# Patient Record
Sex: Female | Born: 1985 | Hispanic: No | Marital: Single | Smoking: Current every day smoker
Health system: Southern US, Community
[De-identification: ages and names within clinical notes are randomized; demographics above are authoritative.]

## PROBLEM LIST (undated history)

## (undated) DIAGNOSIS — D573 Sickle-cell trait: Secondary | ICD-10-CM

## (undated) DIAGNOSIS — J45909 Unspecified asthma, uncomplicated: Secondary | ICD-10-CM

## (undated) DIAGNOSIS — F4310 Post-traumatic stress disorder, unspecified: Secondary | ICD-10-CM

## (undated) DIAGNOSIS — F32A Depression, unspecified: Secondary | ICD-10-CM

## (undated) DIAGNOSIS — F329 Major depressive disorder, single episode, unspecified: Secondary | ICD-10-CM

## (undated) DIAGNOSIS — B1920 Unspecified viral hepatitis C without hepatic coma: Secondary | ICD-10-CM

## (undated) DIAGNOSIS — F419 Anxiety disorder, unspecified: Secondary | ICD-10-CM

## (undated) DIAGNOSIS — N189 Chronic kidney disease, unspecified: Secondary | ICD-10-CM

## (undated) DIAGNOSIS — F319 Bipolar disorder, unspecified: Secondary | ICD-10-CM

## (undated) DIAGNOSIS — G43909 Migraine, unspecified, not intractable, without status migrainosus: Secondary | ICD-10-CM

## (undated) DIAGNOSIS — M79609 Pain in unspecified limb: Secondary | ICD-10-CM

## (undated) HISTORY — PX: ESSURE TUBAL LIGATION: SUR464

## (undated) HISTORY — PX: NO PAST SURGERIES: SHX2092

## (undated) HISTORY — PX: WISDOM TOOTH EXTRACTION: SHX21

## (undated) MED ORDER — TRIMETHOPRIM-POLYMYXIN B 0.1 %-10,000 UNIT/ML EYE DROPS: 10000 unit- 1 mg/mL | OPHTHALMIC | Status: AC

## (undated) MED ORDER — TRAMADOL 50 MG TAB: 50 mg | ORAL_TABLET | Freq: Four times a day (QID) | ORAL | Status: AC | PRN

---

## 2008-06-10 LAB — METABOLIC PANEL, COMPREHENSIVE
A-G Ratio: 1.2 (ref 1.1–2.2)
ALT (SGPT): 29 U/L — ABNORMAL LOW (ref 30–65)
AST (SGOT): 13 U/L — ABNORMAL LOW (ref 15–37)
Albumin: 4.4 g/dL (ref 3.5–5.0)
Alk. phosphatase: 108 U/L (ref 50–136)
Anion gap: 8 mmol/L (ref 5–15)
BUN/Creatinine ratio: 9 — ABNORMAL LOW (ref 12–20)
BUN: 8 MG/DL (ref 6–20)
Bilirubin, total: 0.6 MG/DL (ref <1.0)
CO2: 25 MMOL/L (ref 21–32)
Calcium: 9 MG/DL (ref 8.5–10.1)
Chloride: 104 MMOL/L (ref 97–108)
Creatinine: 0.9 MG/DL (ref 0.6–1.3)
GFR est AA: >60 mL/min/{1.73_m2} (ref >60)
GFR est non-AA: >60 mL/min/{1.73_m2} (ref >60)
Globulin: 3.7 g/dL (ref 2.0–4.0)
Glucose: 102 MG/DL — ABNORMAL HIGH (ref 50–100)
Potassium: 4 MMOL/L (ref 3.5–5.1)
Protein, total: 8.1 g/dL (ref 6.4–8.2)
Sodium: 137 MMOL/L (ref 136–145)

## 2008-06-10 LAB — CBC WITH AUTOMATED DIFF
ABS. BASOPHILS: 0.1 10*3/uL (ref 0.0–0.1)
ABS. EOSINOPHILS: 0.1 10*3/uL (ref 0.0–0.4)
ABS. LYMPHOCYTES: 2.5 10*3/uL (ref 0.8–3.5)
ABS. MONOCYTES: 0.6 10*3/uL (ref 0–1.0)
ABS. NEUTROPHILS: 5 10*3/uL (ref 1.8–8.0)
BASOPHILS: 1 % (ref 0–1)
EOSINOPHILS: 1 % (ref 0–7)
HCT: 39.7 % (ref 35.0–47.0)
HGB: 13.9 g/dL (ref 11.5–16.0)
LYMPHOCYTES: 30 % (ref 12–49)
MCH: 30.2 PG (ref 26.0–34.0)
MCHC: 35 g/dL (ref 30.0–35.0)
MCV: 86.1 FL (ref 80.0–99.0)
MONOCYTES: 7 % (ref 5–13)
NEUTROPHILS: 61 % (ref 32–75)
PLATELET: 271 10*3/uL (ref 150–400)
RBC: 4.61 M/uL (ref 3.80–5.20)
RDW: 14.7 % — ABNORMAL HIGH (ref 11.5–14.5)
WBC: 8.3 10*3/uL (ref 3.6–11.0)

## 2008-06-10 LAB — URINALYSIS W/ REFLEX CULTURE
Bilirubin: NEGATIVE
Blood: NEGATIVE
Glucose: NEGATIVE MG/DL
Leukocyte Esterase: NEGATIVE
Nitrites: NEGATIVE
Protein: NEGATIVE MG/DL
Specific gravity: 1.013 (ref 1.003–1.030)
Urobilinogen: 0.2 EU/DL (ref 0.2–1.0)
pH (UA): 7 (ref 5.0–8.0)

## 2008-06-10 LAB — HCG URINE, QL: HCG urine, QL: POSITIVE — AB

## 2008-06-10 LAB — BETA HCG, QT
Beta HCG, QT: 39247 m[IU]/mL — ABNORMAL HIGH (ref 0–6)
hCG Quant: 39247 m[IU]/mL — ABNORMAL HIGH (ref 0–6)

## 2008-06-10 LAB — BLOOD TYPE, (ABO+RH)
ABO/Rh(D): O POS
ABO/Rh: O POS

## 2008-06-11 LAB — CULTURE, URINE
Colonies Counted: 45000
Colony Count: 45000

## 2008-07-12 LAB — CHLAMYDIA DNA PROBE: Chlamydia, External: NEGATIVE

## 2008-07-12 LAB — N GONORRHOEAE, DNA PROBE: Gonorrhea, External: NEGATIVE

## 2008-07-12 LAB — BLOOD TYPE, (ABO+RH)
ABO,Rh: O POS
TYPE, ABO & RH, EXTERNAL: O POS

## 2008-07-14 LAB — HEP B SURFACE AG: HBsAg, External: NEGATIVE

## 2008-07-14 LAB — RUBELLA AB, IGM: Rubella, External: IMMUNE

## 2008-07-14 LAB — RPR
RPR, EXTERNAL: NEGATIVE
RPR, External: NEGATIVE

## 2008-07-14 LAB — HIV 1 WESTERN BLOT
HIV, EXTERNAL: NONREACTIVE
HIV, External: NONREACTIVE

## 2009-01-03 LAB — GYN RAPID GP B STREP: GrBStrep, External: NEGATIVE

## 2009-01-21 LAB — CBC WITH AUTOMATED DIFF
ABS. BASOPHILS: 0 10*3/uL (ref 0.0–0.1)
ABS. EOSINOPHILS: 0.1 10*3/uL (ref 0.0–0.4)
ABS. LYMPHOCYTES: 2.6 10*3/uL (ref 0.8–3.5)
ABS. MONOCYTES: 0.5 10*3/uL (ref 0.0–1.0)
ABS. NEUTROPHILS: 7.1 10*3/uL (ref 1.8–8.0)
BASOPHILS: 0 % (ref 0–1)
EOSINOPHILS: 1 % (ref 0–7)
HCT: 30.8 % — ABNORMAL LOW (ref 35.0–47.0)
HGB: 10.8 g/dL — ABNORMAL LOW (ref 11.5–16.0)
LYMPHOCYTES: 25 % (ref 12–49)
MCH: 30.1 PG (ref 26.0–34.0)
MCHC: 35.1 g/dL (ref 30.0–36.5)
MCV: 85.8 FL (ref 80.0–99.0)
MONOCYTES: 5 % (ref 5–13)
NEUTROPHILS: 69 % (ref 32–75)
PLATELET: 197 10*3/uL (ref 150–400)
RBC: 3.59 M/uL — ABNORMAL LOW (ref 3.80–5.20)
RDW: 14.5 % (ref 11.5–14.5)
WBC: 10.4 10*3/uL (ref 3.6–11.0)

## 2009-01-21 LAB — METABOLIC PANEL, COMPREHENSIVE
A-G Ratio: 0.7 — ABNORMAL LOW (ref 1.1–2.2)
ALT (SGPT): 37 U/L (ref 12–78)
AST (SGOT): 22 U/L (ref 15–37)
Albumin: 2.8 g/dL — ABNORMAL LOW (ref 3.5–5.0)
Alk. phosphatase: 305 U/L — ABNORMAL HIGH (ref 50–136)
Anion gap: 10 mmol/L (ref 5–15)
BUN/Creatinine ratio: 10 — ABNORMAL LOW (ref 12–20)
BUN: 6 MG/DL (ref 6–20)
Bilirubin, total: 0.4 MG/DL (ref 0.2–1.0)
CO2: 23 MMOL/L (ref 21–32)
Calcium: 8.9 MG/DL (ref 8.5–10.1)
Chloride: 103 MMOL/L (ref 97–108)
Creatinine: 0.6 MG/DL (ref 0.6–1.3)
GFR est AA: >60 mL/min/{1.73_m2} (ref >60)
GFR est non-AA: >60 mL/min/{1.73_m2} (ref >60)
Globulin: 3.8 g/dL (ref 2.0–4.0)
Glucose: 101 MG/DL — ABNORMAL HIGH (ref 65–100)
Potassium: 3.4 MMOL/L — ABNORMAL LOW (ref 3.5–5.1)
Protein, total: 6.6 g/dL (ref 6.4–8.2)
Sodium: 136 MMOL/L (ref 136–145)

## 2009-01-21 LAB — URIC ACID: Uric acid: 5.5 MG/DL (ref 2.6–6.0)

## 2009-01-21 NOTE — Progress Notes (Signed)
Report given to T Taylor RN Kardex,Mar, and orders reviewed at bedside

## 2009-01-21 NOTE — Progress Notes (Signed)
1940 24hr urine started at 1500

## 2009-01-21 NOTE — Progress Notes (Signed)
Pt complains of pain in the right rib area, and pelvic area that has been going on for some time now.  Pt states she feels like her ribs are broken, and states she feels like her pelvic bone is cracking.  Called Dr. Rosine Door for orders, received order for tylenol and ambien.  A few minutes after hanging up with the Dr., the friend of the pt notified me that the pt was vomiting.  Dr. Rosine Door was called back and notified of the vomiting, order received for Po zofran, pt has no IV access.  Requested parameters for BP, given greater than 160/110 consecutive.  Pt refused tylenol or zofran at this time because of upset stomach.  Pt placed back on the EFM and toco to see if she is contracting. VS stable, BP is within parameters.

## 2009-01-21 NOTE — Progress Notes (Signed)
1815 pih labs obtained  and sent to lab

## 2009-01-22 LAB — PROTEIN, URINE, 24 HR
Period of collection: 24 hr
Protein,urine 24 hr: 263 MG/24HR — ABNORMAL HIGH (ref <149)
Volume: 2625 mL

## 2009-01-22 MED ADMIN — acetaminophen (TYLENOL) tablet 650 mg: ORAL | @ 05:00:00 | NDC 51645070310

## 2009-01-22 MED FILL — MAPAP (ACETAMINOPHEN) 325 MG TABLET: 325 mg | ORAL | Qty: 2

## 2009-01-22 NOTE — Progress Notes (Signed)
Ante Partum High Risk Pregnancy Note    Patient admitted for preeclampsia states she does not  have  right upper quadrant pain  .    LOS:  3 days  Vitals: Temp (24hrs), Avg:97.2 ??F (36.2 ??C), Min:96 ??F (35.6 ??C), Max:98.6 ??F (37 ??C)   Patient BP in the past 24 hrs:   BP   01/22/09 0654 108/69 mmHg   01/22/09 0337 126/69 mmHg   01/21/09 2303 123/87 mmHg   01/21/09 2250 137/92 mmHg   01/21/09 2240 138/82 mmHg   01/21/09 2210 141/90 mmHg   01/21/09 2015 147/87 mmHg   01/21/09 1937 135/83 mmHg   01/21/09 1726 141/82 mmHg         I&O:                 In: 1237 (1237 P.O.)  Out: -     Exam:  Patient without distress.               Abdomen: soft, non-tender               Fundus: soft and non tender               Fundal Height: 35 cm               Right Upper Quadrant: non-tender               Perineum: No sign of blood or amniotic fluid               Lower Extremities: No               Patellar Reflexes: absent               Clonus: absent               Fetal Monitoring:  Baseline: 130 bpm   Uterine Activity: None    Dilation: 1 cm     Effacement: 25%    Station: Floating               NST:  reactive           Labs: Recent Results (from the past 24 hour(s))   CBC WITH AUTOMATED DIFF    Collection Time 01/21/09  6:30 PM   Component Value Range   ??? WBC 10.4  3.6 - 11.0 (K/uL)   ??? RBC 3.59 (*) 3.80 - 5.20 (M/uL)   ??? HGB 10.8 (*) 11.5 - 16.0 (g/dL)   ??? HCT 30.8 (*) 35.0 - 47.0 (%)   ??? MCV 85.8  80.0 - 99.0 (FL)   ??? MCH 30.1  26.0 - 34.0 (PG)   ??? MCHC 35.1  30.0 - 36.5 (g/dL)   ??? RDW 14.5  11.5 - 14.5 (%)   ??? PLATELET 197  150 - 400 (K/uL)   ??? NEUTROPHILS 69  32 - 75 (%)   ??? LYMPHOCYTES 25  12 - 49 (%)   ??? MONOCYTES 5  5 - 13 (%)   ??? EOSINOPHILS 1  0 - 7 (%)   ??? BASOPHILS 0  0 - 1 (%)   ??? ABSOLUTE NEUTS 7.1  1.8 - 8.0 (K/UL)   ??? ABSOLUTE LYMPHS 2.6  0.8 - 3.5 (K/UL)   ??? ABSOLUTE MONOS 0.5  0.0 - 1.0 (K/UL)   ??? ABSOLUTE EOSINS 0.1  0.0 - 0.4 (K/UL)   ??? ABSOLUTE BASOS 0.0  0.0 - 0.1 (K/UL)   METABOLIC PANEL, COMPREHENSIVE  Collection Time 01/21/09  6:30 PM   Component Value Range   ??? Sodium 136  136 - 145 (MMOL/L)   ??? Potassium 3.4 (*) 3.5 - 5.1 (MMOL/L)   ??? Chloride 103  97 - 108 (MMOL/L)   ??? CO2 23  21 - 32 (MMOL/L)   ??? Anion gap 10  5 - 15 (mmol/L)   ??? Glucose 101 (*) 65 - 100 (MG/DL)   ??? BUN 6  6 - 20 (MG/DL)   ??? Creatinine 0.6  0.6 - 1.3 (MG/DL)   ??? BUN/Creatinine ratio 10 (*) 12 - 20 ( )   ??? GFR est AA >60  >60 (ml/min/1.64m2)   ??? GFR est non-AA >60  >60 (ml/min/1.77m2)   ??? Calcium 8.9  8.5 - 10.1 (MG/DL)   ??? Bilirubin, total 0.4  0.2 - 1.0 (MG/DL)   ??? ALT 37  12 - 78 (U/L)   ??? AST 22  15 - 37 (U/L)   ??? Alk. phosphatase 305 (*) 50 - 136 (U/L)   ??? Protein, total 6.6  6.4 - 8.2 (g/dL)   ??? Albumin 2.8 (*) 3.5 - 5.0 (g/dL)   ??? Globulin 3.8  2.0 - 4.0 (g/dL)   ??? A-G Ratio 0.7 (*) 1.1 - 2.2 ( )   URIC ACID    Collection Time 01/21/09  6:30 PM   Component Value Range   ??? Uric acid 5.5  2.6 - 6.0 (MG/DL)         Assessment and Plan:      Patient Active Hospital Problem List:   * No active hospital problems. *         Preeclampsia:  mild  Check 24 hour urine for total Protein  Depression:  Continue current medication.Mcarthur Ivins A. Rosine Door, MD

## 2009-01-22 NOTE — Progress Notes (Signed)
Bedside report given to Lindie Spruce RN.

## 2009-01-22 NOTE — Progress Notes (Signed)
NST completed.  FHR baseline 130's. Positive accelerations.  Meet criteria for reactivity.  Some uterine irritability, patient has no c/o feeling uterine contractions or pain or pressure.

## 2009-01-22 NOTE — Progress Notes (Signed)
Phone call received from Dr. Morene Rankins requesting results of patients 24 hour urine.  Results read to him as well as recent pressures.  Dr. Morene Rankins ordered patient discharged home if blood pressure taken at 1800 was below 140/90.  To be seen at 0900 on Monday, May 3 by Dr. Morene Rankins  for a blood pressure check in the office.  Patient pressure was 135/83 at 1800.  Discussed results and physician orders with patient.  Discussed remaining on bedrest at home with bathroom privileges.  Patient and significant other verbalized understanding.  Discussed warning signs such as decreased fetal movement, leakage of fluid from her vagina and swelling.  Also, discussed typical s/s of PIH, such as epigastric pain, headache and visual disturbances.  Patient understands to contact physician on call if she has any issues arise or has any problems after discharge.  Wheeled to front for discharge with family.

## 2009-01-24 ENCOUNTER — Inpatient Hospital Stay
Admit: 2009-01-24 | Discharge: 2009-01-27 | Disposition: A | Discharge status: Home / Self Care | Payer: PRIVATE HEALTH INSURANCE | Attending: Specialist | Admitting: Specialist

## 2009-01-24 DIAGNOSIS — IMO0002 Reserved for concepts with insufficient information to code with codable children: Secondary | ICD-10-CM

## 2009-01-24 LAB — METABOLIC PANEL, COMPREHENSIVE
A-G Ratio: 0.8 — ABNORMAL LOW (ref 1.1–2.2)
ALT (SGPT): 33 U/L (ref 12–78)
AST (SGOT): 18 U/L (ref 15–37)
Albumin: 3 g/dL — ABNORMAL LOW (ref 3.5–5.0)
Alk. phosphatase: 355 U/L — ABNORMAL HIGH (ref 50–136)
Anion gap: 11 mmol/L (ref 5–15)
BUN/Creatinine ratio: 10 — ABNORMAL LOW (ref 12–20)
BUN: 6 MG/DL (ref 6–20)
Bilirubin, total: 0.4 MG/DL (ref 0.2–1.0)
CO2: 23 MMOL/L (ref 21–32)
Calcium: 9.2 MG/DL (ref 8.5–10.1)
Chloride: 106 MMOL/L (ref 97–108)
Creatinine: 0.6 MG/DL (ref 0.6–1.3)
GFR est AA: >60 mL/min/{1.73_m2} (ref >60)
GFR est non-AA: >60 mL/min/{1.73_m2} (ref >60)
Globulin: 3.6 g/dL (ref 2.0–4.0)
Glucose: 104 MG/DL — ABNORMAL HIGH (ref 65–100)
Potassium: 4.3 MMOL/L (ref 3.5–5.1)
Protein, total: 6.6 g/dL (ref 6.4–8.2)
Sodium: 140 MMOL/L (ref 136–145)

## 2009-01-24 LAB — LD: LD: 149 U/L (ref 100–200)

## 2009-01-24 LAB — URIC ACID: Uric acid: 4.9 MG/DL (ref 2.6–6.0)

## 2009-01-24 MED ADMIN — sodium chloride (NS) 0.9 % flush: @ 20:00:00 | NDC 87701099893

## 2009-01-24 MED ADMIN — dinoprostone (CERVIDIL) 10 mg vaginal insert: @ 20:00:00 | NDC 00456412363

## 2009-01-24 MED ADMIN — acetaminophen (TYLENOL) tablet 650 mg: ORAL | @ 18:00:00 | NDC 51645070310

## 2009-01-24 MED ADMIN — sodium chloride (NS) 0.9 % flush: @ 17:00:00 | NDC 87701099893

## 2009-01-24 MED FILL — MAPAP (ACETAMINOPHEN) 325 MG TABLET: 325 mg | ORAL | Qty: 2

## 2009-01-24 MED FILL — SALINE FLUSH INJECTION SYRINGE: INTRAMUSCULAR | Qty: 10

## 2009-01-24 MED FILL — OXYTOCIN IN D5W 30 UNIT/500 ML IV: 30 unit/500 mL | INTRAVENOUS | Qty: 500

## 2009-01-24 MED FILL — CERVIDIL 10 MG VAGINAL INSERT,CONTROLLED RELEASE: 10 mg | VAGINAL | Qty: 1

## 2009-01-24 MED FILL — PROCHLORPERAZINE EDISYLATE 5 MG/ML INJECTION: 5 mg/mL | INTRAMUSCULAR | Qty: 2

## 2009-01-24 NOTE — Progress Notes (Signed)
Pt c/o pain 7/10.  sve done.  Cervidil fell out.  Pt 2cm but 90% effaced.  LR bolus started

## 2009-01-24 NOTE — Progress Notes (Signed)
Pt in left lateral; efm and toco replaced

## 2009-01-24 NOTE — Progress Notes (Signed)
Dr Orson Slick in. Strip reviewed. Cervidil placed

## 2009-01-24 NOTE — Progress Notes (Signed)
Report given to Regino Schultze, RN.

## 2009-01-24 NOTE — Progress Notes (Signed)
Dr Bethena Midget called.  Pt still 2cm, cervidil is out, contracting q28mins, demerol and fluid bolus given.  Orders to recheck cervix in an hr

## 2009-01-24 NOTE — H&P (Signed)
Labor Induction Admission History & Physical    Name: Dean SADIYA DURAND MRN: [id number]  SSN: [id number]    Date of Birth: 12/14/1985  Age: 23 y.o.  Sex: female      Subjective:     Estimated Date of Delivery: 02/04/09  OB History    Grav Para Term Preterm Abortions TAB SAB Ect Mult Living    2 1        1             Patient admitted with pregnancy at [redacted]w[redacted]d for induction of labor due to preeclampsia. Prenatal course was was complicated by pregnancy induced hypertension.  Please see prenatal records for details.    Past Medical History   Diagnosis Date   ??? Sickle-cell disease, unspecified trait       No past surgical history on file.  History   Social History   ??? Marital Status: Single     Spouse Name: N/A     Number of Children: N/A   ??? Years of Education: N/A   Occupational History   ??? Not on file.   Social History Main Topics   ??? Tobacco Use: Yes -- 0.5 packs/day   ??? Alcohol Use: No   ??? Drug Use: No   ??? Sexually Active: Yes -- Female partner(s)   Other Topics Concern   ??? Not on file   Social History Narrative   ??? No narrative on file       Family History   Problem Relation   ??? Diabetes Mother   ??? Hypertension Mother   ??? Asthma Mother   ??? Migraines Mother   ??? Hypertension Father   ??? Alcohol abuse Father   ??? Asthma Brother       No Known Allergies  Prior to Admission medications    Not on File          Review of Systems: A comprehensive review of systems was negative except for that written in the HPI.    Objective:     Vitals:  Filed Vitals:    01/24/2009 11:27 AM 01/24/2009  3:16 PM   BP: 123/73 121/66   Pulse: 82 78   Temp: 98.7 ??F (37.1 ??C)    Resp: 18 18   Height: 5\' 6"  (1.676 m)    Weight: 161 lb (73.029 kg)    SpO2: 99%           Physical Exam:  Cervical Exam:  2 cm dilated    Membranes:  Intact  Fetal Heart Rate: Baseline: 140 per minute  Variability: moderate  Accelerations: yes          Prenatal Labs:   Lab Results   Component Value Date/Time   ??? ABO/Rh(D) O POS 06/10/2008 12:25 PM    ??? Rubella immune 36 07/14/2008   ??? GrBS neg 01/03/2009   ??? HBsAg neg 07/14/2008   ??? HIV non reactive 07/14/2008   ??? RPR neg 07/14/2008          Impression/Plan:     Plan: Admit for induction of labor. Group B Strep negative.      Signed By:  Horald Pollen, MD     Jan 24, 2009

## 2009-01-24 NOTE — Progress Notes (Signed)
Spoke with Dr Bethena Midget. Advised that pt is 8 cm with bulging bag, very uncomfortable, bolusing for epidural at this time. He will be coming in.

## 2009-01-24 NOTE — Progress Notes (Signed)
Report received from k bennet rn.  Will assume care

## 2009-01-24 NOTE — Progress Notes (Signed)
Returned to bed and EFM.

## 2009-01-24 NOTE — Progress Notes (Signed)
Looked for lab results; none noted l stevens rn called lab; lab states has stbb and cmp but no lavendar for cbc; labs usually drawn and sent together. Lab insist no lavendar received; will notify dr Isla Pence

## 2009-01-24 NOTE — Progress Notes (Signed)
CBC, STBB, CMP, uric acid, LDH drawn and sent to lab.  Heplock initiated and flushed.

## 2009-01-24 NOTE — Progress Notes (Signed)
Called anesthesia for epid

## 2009-01-24 NOTE — Procedures (Signed)
Epidural Procedure Note    Risk and benefits were discussed with the patient and plans are to proceed.     Patient was placed in the seated position.     The back was prepped at the lumbar region with Duraprep.     0.25% bupivicaine was used as local at L3 - L4.     A 17 Tuohy needle was passed x 1 attempt(s) at the above stated level.     Loss of resistance was obtained using air.     A 19 g epidural catheter was placed/secured at 9 cm at the skin.     Aspiration was negative.     negative paresthesia.    Test dose with 3 mL 0.25% bupivicaine with 1:200 epinephrine was negative.    Catheter was secured with tegaderm and tape.

## 2009-01-24 NOTE — Progress Notes (Signed)
Report given to T Ulla Potash.  She will assume care

## 2009-01-24 NOTE — Progress Notes (Signed)
Dr Isla Pence at bedside; ;aware no lab results; lab states never received  Pt sitting on side of bed

## 2009-01-24 NOTE — Progress Notes (Signed)
Lunch tray served.

## 2009-01-24 NOTE — Progress Notes (Signed)
REPORT TO H. CAMPBELL RN WHO WILL ASSUME CARE

## 2009-01-24 NOTE — Progress Notes (Signed)
Out of bed to BR to void.

## 2009-01-24 NOTE — Procedures (Signed)
Epidural Procedure Note    Risk and benefits were discussed with the patient and plans are to proceed.     Patient was placed in the seated position.     The back was prepped at the lumbar region with Duraprep.     0.25% bupivicaine was used as local at L3 - L4.     A 17 Tuohy needle was passed x 1 attempt(s) at the above stated level.     Loss of resistance was obtained using air.     A 19 g epidural catheter was placed/secured at 9 cm at the skin.     Aspiration was negative.     negative paresthesia.    Test dose with 3 mL 0.25% bupivicaine with 1:200 epinephrine was negative.    Catheter was secured with tegaderm and tape.

## 2009-01-25 MED ADMIN — oxycodone-acetaminophen (PERCOCET) 5-325 mg per tablet 1-2 Tab: ORAL | @ 21:00:00 | NDC 68084035511

## 2009-01-25 MED ADMIN — lactated ringers infusion: INTRAVENOUS | @ 03:00:00 | NDC 00409795309

## 2009-01-25 MED ADMIN — meperidine (DEMEROL) 50 mg/mL injection: NDC 10019016044

## 2009-01-25 MED ADMIN — oxycodone-acetaminophen (PERCOCET) 5-325 mg per tablet 1-2 Tab: ORAL | @ 06:00:00 | NDC 68084035511

## 2009-01-25 MED ADMIN — ibuprofen (MOTRIN) tablet 800 mg: ORAL | @ 21:00:00 | NDC 00904174840

## 2009-01-25 MED ADMIN — oxycodone-acetaminophen (PERCOCET) 5-325 mg per tablet 1-2 Tab: ORAL | @ 11:00:00 | NDC 68084035511

## 2009-01-25 MED ADMIN — ibuprofen (MOTRIN) tablet 800 mg: ORAL | @ 13:00:00 | NDC 00904174840

## 2009-01-25 MED ADMIN — bupivacaine 0.25% -epinephrine 1:200,000 (SENSORCAINE) 0.25 %-1:200,000 infusion: EPIDURAL | @ 04:00:00 | NDC 00409904202

## 2009-01-25 MED ADMIN — oxycodone-acetaminophen (PERCOCET) 5-325 mg per tablet 1-2 Tab: ORAL | @ 16:00:00 | NDC 68084035511

## 2009-01-25 MED ADMIN — lactated ringers infusion: INTRAVENOUS | NDC 00409795309

## 2009-01-25 MED ADMIN — oxytocin in d5w (PITOCIN) 30 unit/500 mL infusion Soln: INTRAVENOUS | @ 04:00:00 | NDC 61553072303

## 2009-01-25 MED ADMIN — zolpidem (AMBIEN) tablet 10 mg: ORAL | @ 01:00:00 | NDC 68084022511

## 2009-01-25 MED ADMIN — fentanyl-bupivacaine in NS(PF) 2-0.125 mcg/mL-% epidural infusion: EPIDURAL | @ 04:00:00 | NDC 02420043204

## 2009-01-25 MED FILL — BUPIVACAINE-EPINEPHRINE (PF) 0.25 %-1:200,000 IJ SOLN: 0.25 %-1:200,000 | INTRAMUSCULAR | Qty: 30

## 2009-01-25 MED FILL — FENTANYL-BUPIVACAINE IN NS (PF) 2 MCG/ML-0.125 % PUMP RESERV,PREFILLED: 2 mcg/mL- 0.15 % | EPIDURAL | Qty: 100

## 2009-01-25 MED FILL — LACTATED RINGERS IV: INTRAVENOUS | Qty: 1000

## 2009-01-25 MED FILL — OXYCODONE-ACETAMINOPHEN 5 MG-325 MG TAB: 5-325 mg | ORAL | Qty: 2

## 2009-01-25 MED FILL — EPHEDRINE SULFATE 50 MG/ML IJ SOLN: 50 mg/mL | INTRAMUSCULAR | Qty: 1

## 2009-01-25 MED FILL — DEMEROL (PF) 50 MG/ML INJECTION SYRINGE: 50 mg/mL | INTRAMUSCULAR | Qty: 1

## 2009-01-25 MED FILL — IBUPROFEN 400 MG TAB: 400 mg | ORAL | Qty: 2

## 2009-01-25 MED FILL — LIDOCAINE (PF) 10 MG/ML (1 %) IJ SOLN: 10 mg/mL (1 %) | INTRAMUSCULAR | Qty: 30

## 2009-01-25 MED FILL — FENTANYL CITRATE (PF) 50 MCG/ML IJ SOLN: 50 mcg/mL | INTRAMUSCULAR | Qty: 2

## 2009-01-25 MED FILL — ZOLPIDEM 5 MG TAB: 5 mg | ORAL | Qty: 2

## 2009-01-25 NOTE — Progress Notes (Signed)
Report received on mom from t crane, and on baby from s [place].

## 2009-01-25 NOTE — Progress Notes (Signed)
svd of a viable female infant over an intact perineum in oa position. No nuchal cord. No shoulder dystocia. Left periurethral tear repaired with 4-0 vicryl. Rt. Periurethral tear hemostatic. Placenta del spont, intact with 3vc. ebl .

## 2009-01-25 NOTE — Progress Notes (Signed)
TRANSFER - OUT REPORT:    Verbal report given to Marni Griffon RN on Ashley Mills  being transferred to [facility] for routine progression of care       Report consisted of patient???s Situation, Background, Assessment and   Recommendations(SBAR).     Information from the following report(s) SBAR was reviewed with the receiving nurse.    Opportunity for questions and clarification was provided.

## 2009-01-25 NOTE — Progress Notes (Signed)
Lactation consult initiated. Please see newborn chart.

## 2009-01-25 NOTE — Progress Notes (Signed)
Bedside report received from J.Woolard, RN.  All questions answered.

## 2009-01-25 NOTE — Progress Notes (Signed)
Pt states she requests hospital privileges to go outside to smoke.  Called Dr. Morene Rankins for an order, however he wrote an order for an nicotine patch.  Pt instructed she was not given orders, however she chose to leaves against medical advice.

## 2009-01-25 NOTE — Progress Notes (Signed)
Patient wants to go off unit to smoke, but advised per Dr. Rosine Door this am that he is not giving orders off unit unit until patient at least 24 hours.  Patient verbally upset stating she went off unit with her first baby right she was born.  Also advised about borderline blood pressure and needs to be on unit.  Advised her to call on call doctor.

## 2009-01-26 MED ADMIN — oxycodone-acetaminophen (PERCOCET) 5-325 mg per tablet 1-2 Tab: ORAL | @ 06:00:00 | NDC 68084035511

## 2009-01-26 MED ADMIN — ibuprofen (MOTRIN) tablet 800 mg: ORAL | @ 20:00:00 | NDC 00904174840

## 2009-01-26 MED ADMIN — oxycodone-acetaminophen (PERCOCET) 5-325 mg per tablet 1-2 Tab: ORAL | @ 15:00:00 | NDC 68084035511

## 2009-01-26 MED ADMIN — oxycodone-acetaminophen (PERCOCET) 5-325 mg per tablet 1-2 Tab: ORAL | @ 20:00:00 | NDC 68084035511

## 2009-01-26 MED ADMIN — ibuprofen (MOTRIN) tablet 800 mg: ORAL | @ 12:00:00 | NDC 00904174840

## 2009-01-26 MED ADMIN — oxycodone-acetaminophen (PERCOCET) 5-325 mg per tablet 1-2 Tab: ORAL | @ 02:00:00 | NDC 68084035511

## 2009-01-26 MED ADMIN — ibuprofen (MOTRIN) tablet 800 mg: ORAL | @ 02:00:00 | NDC 00904174840

## 2009-01-26 MED ADMIN — nicotine (NICODERM CQ) 21 mg/24 hr patch 1 Patch: TRANSDERMAL | @ 21:00:00 | NDC 00067512609

## 2009-01-26 MED FILL — IBUPROFEN 400 MG TAB: 400 mg | ORAL | Qty: 2

## 2009-01-26 MED FILL — OXYCODONE-ACETAMINOPHEN 5 MG-325 MG TAB: 5-325 mg | ORAL | Qty: 2

## 2009-01-26 MED FILL — NICOTINE 21 MG/24 HR DAILY PATCH: 21 mg/24 hr | TRANSDERMAL | Qty: 1

## 2009-01-26 NOTE — H&P (Signed)
Routine Labor Admission History & Physical    Name: Ashley Mills MRN: [id number]  SSN: [id number]    Date of Birth: 17-Oct-1985  Age: 23 y.o.  Sex: female        Subjective: 23 yo at term c/o contractions, abd pain.     Estimated Date of Delivery: 02/04/09  OB History    Grav Para Term Preterm Abortions TAB SAB Ect Mult Living    2 1        1             Patient admitted with pregnancy at [redacted]w[redacted]d for active labor. Prenatal course was normal. Please see prenatal records for details.    Past Medical History   Diagnosis Date   ??? Sickle-cell disease, unspecified trait       No past surgical history on file.  History   Social History   ??? Marital Status: Single     Spouse Name: N/A     Number of Children: N/A   ??? Years of Education: N/A   Occupational History   ??? Not on file.   Social History Main Topics   ??? Tobacco Use: Yes -- 0.5 packs/day   ??? Alcohol Use: No   ??? Drug Use: No   ??? Sexually Active: Yes -- Female partner(s)   Other Topics Concern   ??? Not on file   Social History Narrative   ??? No narrative on file       Family History   Problem Relation   ??? Diabetes Mother   ??? Hypertension Mother   ??? Asthma Mother   ??? Migraines Mother   ??? Hypertension Father   ??? Alcohol abuse Father   ??? Asthma Brother       No Known Allergies  Prior to Admission medications    Not on File          Review of Systems: A comprehensive review of systems was negative except for that written in the HPI.    Objective:     Vitals:  Filed Vitals:    01/21/2009 10:50 PM 01/21/2009 11:03 PM 01/22/2009  3:37 AM 01/22/2009  6:54 AM   BP: 137/92 123/87 126/69 108/69   Pulse:  82 82 81   Temp:   97.5 ??F (36.4 ??C) 96.7 ??F (35.9 ??C)   Resp:   16 16   SpO2:              Physical Exam:  Cervical Exam:  3 cm  Membranes:  Intact  Fetal Heart Rate: Baseline: 144 per minute    Prenatal Labs:   Lab Results   Component Value Date/Time   ??? ABO,Rh o positive 07/12/2008   ??? Rubella immune 36 07/14/2008   ??? GrBS neg 01/03/2009   ??? HBsAg neg 07/14/2008    ??? HIV non reactive 07/14/2008   ??? RPR neg 07/14/2008   ??? Gonorrhea neg 07/12/2008   ??? Chlamydia neg 07/12/2008          Assessment/Plan:     Plan: Admit for Reassuring fetal status.  Group B Strep was done.    Signed By:  Randye Lobo, MD     Jan 26, 2009

## 2009-01-26 NOTE — Progress Notes (Signed)
Post-Partum Day Number 1 Progress Note    Patient doing well post-partum without significant complaint.  Voiding withour difficulty, normal lochia.    Vitals:  Patient Vitals in the past 8 hrs:   BP Temp Temp src Pulse Resp SpO2 Height Weight   01/26/09 0300 131/84 mmHg 97.7 ??F (36.5 ??C) - 70  18  - - -       Temp (24hrs), Avg:97.5 ??F (36.4 ??C), Min:96.5 ??F (35.8 ??C), Max:99.2 ??F (37.3 ??C)      Vital signs stable, afebrile.    Exam:  Patient without distress.               Abdomen soft, fundus firm at level of umbilicus, nontender               Lower extremities are negative for swelling, cords or tenderness.    Labs: No results found for this or any previous visit (from the past 24 hour(s)).    Assessment and Plan:  Patient appears to be having uncomplicated post-partum course.  Continue routine perineal care and maternal education.  Plan discharge tomorrow if no problems occur.

## 2009-01-26 NOTE — Progress Notes (Signed)
Lactation Consult done, please see newborn chart.

## 2009-01-26 NOTE — Progress Notes (Signed)
Report on mom and baby to D.Rowland for shift change

## 2009-01-26 NOTE — Progress Notes (Signed)
Report received from d rowland on mom and baby

## 2009-01-26 NOTE — Progress Notes (Signed)
Received report from Janan Ridge on patient.

## 2009-01-26 NOTE — Progress Notes (Signed)
Report given to Janelle Woolard.

## 2009-01-27 MED ADMIN — oxycodone-acetaminophen (PERCOCET) 5-325 mg per tablet 1-2 Tab: ORAL | @ 01:00:00 | NDC 68084035511

## 2009-01-27 MED ADMIN — oxycodone-acetaminophen (PERCOCET) 5-325 mg per tablet 1-2 Tab: ORAL | @ 05:00:00 | NDC 68084035511

## 2009-01-27 MED ADMIN — ibuprofen (MOTRIN) tablet 800 mg: ORAL | @ 05:00:00 | NDC 00904174840

## 2009-01-27 MED ADMIN — oxycodone-acetaminophen (PERCOCET) 5-325 mg per tablet 1-2 Tab: ORAL | @ 11:00:00 | NDC 68084035511

## 2009-01-27 MED ADMIN — oxycodone-acetaminophen (PERCOCET) 5-325 mg per tablet 1-2 Tab: ORAL | @ 15:00:00 | NDC 68084035511

## 2009-01-27 MED ADMIN — ibuprofen (MOTRIN) tablet 800 mg: ORAL | @ 13:00:00 | NDC 00904174840

## 2009-01-27 MED FILL — OXYCODONE-ACETAMINOPHEN 5 MG-325 MG TAB: 5-325 mg | ORAL | Qty: 2

## 2009-01-27 MED FILL — IBUPROFEN 400 MG TAB: 400 mg | ORAL | Qty: 2

## 2009-01-27 NOTE — Discharge Summary (Signed)
Discharge to home with baby

## 2009-01-27 NOTE — Progress Notes (Signed)
Report given to t brown on mom and baby

## 2009-01-27 NOTE — Progress Notes (Signed)
Discharge home today breast and bottle feeding per mom's choice. Lactation consult done, please see newborn chart.

## 2009-01-27 NOTE — Progress Notes (Signed)
Post-Partum Day Number 2 Progress Note    Ashley Mills     Information for the patient's newborn:  Tekla, Malachowski [[id number]]   Spontaneous Vaginal Delivery   Patient doing well without significant complaint.  Voiding without difficulty, normal lochia.    Vitals:  Visit Vitals   Item Reading   ??? BP 135/88   ??? Pulse 85   ??? Temp 97.9 ??F (36.6 ??C)   ??? Resp 18   ??? Ht 5\' 6"  (1.676 m)   ??? Wt 161 lb (73.029 kg)   ??? SpO2 99%   ??? Breastfeeding Unknown       Temp (24hrs), Avg:98.4 ??F (36.9 ??C), Min:97.9 ??F (36.6 ??C), Max:98.7 ??F (37.1 ??C)          Exam:         Patient without distress.                Abdomen soft, fundus firm, nontender                Lower extremities are negative for swelling, cords or tenderness.    Labs:     Lab Results   Component Value Date/Time   ??? WBC 10.4 01/21/2009  6:30 PM   ??? WBC 8.3 06/10/2008 12:25 PM   ??? HGB 10.8 01/21/2009  6:30 PM   ??? HGB 13.9 06/10/2008 12:25 PM   ??? HCT 30.8 01/21/2009  6:30 PM   ??? HCT 39.7 06/10/2008 12:25 PM   ??? PLATELET 197 01/21/2009  6:30 PM   ??? PLATELET 271 06/10/2008 12:25 PM         No results found for this or any previous visit (from the past 24 hour(s)).    Assessment: Doing well, post partum day 2      Plan:   1. Discharge home today  2. Follow up in office in 6 weeks with Randye Lobo, MD  3. Post partum activity advised, diet as tolerated  4. Discharge Medications: ibuprofen, percocet and medications prior to admission

## 2009-03-31 LAB — URINALYSIS W/ REFLEX CULTURE
Bilirubin: NEGATIVE
Glucose: NEGATIVE MG/DL
Nitrites: NEGATIVE
Protein: NEGATIVE MG/DL
Specific gravity: 1.018 (ref 1.003–1.030)
Urobilinogen: 1 EU/DL (ref 0.2–1.0)
pH (UA): 6 (ref 5.0–8.0)

## 2009-03-31 LAB — WET PREP: Wet prep: NONE SEEN

## 2009-03-31 MED ORDER — CIPROFLOXACIN 500 MG TAB
500 mg | ORAL_TABLET | Freq: Two times a day (BID) | ORAL | Status: AC
Start: 2009-03-31 — End: 2009-04-03

## 2009-03-31 MED FILL — CEFTRIAXONE 1 GRAM SOLUTION FOR INJECTION: 1 gram | INTRAMUSCULAR | Qty: 1

## 2009-03-31 NOTE — ED Notes (Signed)
Pt discharged with written discharge instructions expressed verbal understanding. Pt ambulatory out of ED.

## 2009-03-31 NOTE — ED Provider Notes (Signed)
HPI Comments: 23yo BF presents AMB to ED with C/O vaginal d/c x 2 weeks.  Pt reports some urinary urgency and malodorous urine. Pt denies dysuria, abd pain with urination, F/C or N/V/D. Pt reports hx of chlamydia when her boyfriend of 4 years was exposed. Pt reports onset of menstrual cycle while giving urine sample in ED today. NKDA.     PCP: Dr. Driscilla Moats    Pt reports no further complaints at this time.    Patient is a 23 y.o. female presenting with abdominal pain and vaginal discharge. The history is provided by the patient.   Abdominal Pain   Pertinent negatives include no fever, no diarrhea, no nausea, no vomiting, no dysuria and no hematuria.   Vaginal Discharge   Associated symptoms include abdominal pain. Pertinent negatives include no fever, no diarrhea, no nausea, no vomiting and no dysuria.   (scribe kmh)      Past Medical History   Diagnosis Date   ??? Sickle-cell disease, unspecified trait          No past surgical history on file.      Family History   Problem Relation   ??? Diabetes Mother   ??? Hypertension Mother   ??? Asthma Mother   ??? Migraines Mother   ??? Hypertension Father   ??? Alcohol abuse Father   ??? Asthma Brother          History   Social History   ??? Marital Status: Single     Spouse Name: N/A     Number of Children: N/A   ??? Years of Education: N/A   Occupational History   ??? Not on file.   Social History Main Topics   ??? Tobacco Use: Yes -- 0.5 packs/day   ??? Alcohol Use: No   ??? Drug Use: No   ??? Sexually Active: Yes -- Female partner(s)   Other Topics Concern   ??? Not on file   Social History Narrative   ??? No narrative on file           ALLERGIES: Review of patient's allergies indicates no known allergies.      Review of Systems   Constitutional: Negative.  Negative for fever and chills.   Gastrointestinal: Positive for abdominal pain. Negative for nausea, vomiting and diarrhea.   Genitourinary: Positive for urgency and vaginal discharge. Negative for dysuria and hematuria.    All other systems reviewed and are negative.        Filed Vitals:    03/31/2009  5:41 PM   BP: 133/81   Pulse: 111   Temp: 99.5 ??F (37.5 ??C)   Resp: 18   Height: 5\' 6"  (1.676 m)   Weight: 130 lb 14.4 oz (59.376 kg)   SpO2: 100%              Physical Exam   GEN: NAD  HENT: moist oral mucosa, no erythema or exudate of pharynx, no erythema or edema of nasal mucosa  Eyes: PERRL, EOMI  NECK: no tenderness, painless ROM, no lymphadenopathy  CARD: RRR, no murmur, rub or gallop  LUNG: clear to ausculation bilaterally without wheezes, rhonchi or crackles  ABD: soft, non-tender, non-distended, no rebound or guarding, bowel sounds WNL  EXT: full and painless ROM, no swelling or deformity noted  SKIN: no rash noted  NEURO: alert and nonfocal  GU:  External genitalia normal.  Pelvic exam: cervix normal, ovaries and uterus normal size and non-tender to palpation, no cervical motion tenderness  or adnexal masses. Mild vaginal bleeding, no discharge seen.       Coding      Procedures  PROCEDURE NOTE: PELVIC EXAM  Procedure time 1-15 minutes, Pelvic exam was performed using bimanual and speculum. No complications or bleeding. Pt tolerated procedure well. Performed by Estrella Deeds, MD      7:20 PM  PROGRESS NOTE: Treatment options have been reviewed with patient and pt has elected to be treated for STD at this time and would like to receive rx and ABX in ED. All lab results have been reviewed with patient, pt has been informed that she will be notified of positive results.     8:02 PM  Pt has been re-examined. Given pt's UA results with +blood and bacteria and small leuks, will treat for UTI, pt expresses understanding and agreement. Pt has been consulted on sx, tx, rx, dx and d/c instructions with good understanding. Pt agrees to care plan as outlined and agrees to f/u with PCP as advised. Pt's questions have been answered and pt is ready to go home.

## 2009-04-01 LAB — CHLAMYDIA/GC PCR
Chlamydia amplified: NEGATIVE
N. gonorrhea, amplified: NEGATIVE

## 2009-04-01 MED ORDER — METRONIDAZOLE 500 MG TAB
500 mg | ORAL_TABLET | Freq: Two times a day (BID) | ORAL | Status: AC
Start: 2009-04-01 — End: 2009-04-07

## 2009-04-01 MED ADMIN — cefTRIAXone (ROCEPHIN) 250 mg injection: INTRAMUSCULAR | NDC 00781320685

## 2009-04-01 MED ADMIN — azithromycin (ZITHROMAX) tablet 1,000 mg: ORAL | NDC 51079059101

## 2009-04-01 MED ADMIN — lidocaine (PF) (XYLOCAINE) 10 mg/mL (1 %) injection: NDC 63323049205

## 2009-04-01 MED FILL — AZITHROMYCIN 250 MG TAB: 250 mg | ORAL | Qty: 4

## 2009-04-01 MED FILL — XYLOCAINE-MPF 10 MG/ML (1 %) INJECTION SOLUTION: 10 mg/mL (1 %) | INTRAMUSCULAR | Qty: 5

## 2009-04-01 MED FILL — CEFTRIAXONE 250 MG SOLUTION FOR INJECTION: 250 mg | INTRAMUSCULAR | Qty: 250

## 2009-04-02 LAB — CULTURE, URINE
Colonies Counted: >100000
Colony Count: >100000

## 2010-09-12 ENCOUNTER — Encounter

## 2010-11-24 NOTE — ED Notes (Signed)
Patient states that she began with a headache four days ago.  Today began vomiting.  Does not have a history of migraines

## 2010-11-24 NOTE — ED Provider Notes (Signed)
HPI Comments: Patient states that these headaches started at the beginning of February and only last for a couple of days at a time and then would go away but come back after a couple of days, this time though the head has stayed for 4 days and she started vomiting today.  No hx of migraines but her mother has migraines, she coming off of her menstrual cycle She denies fever/chills, neck pain, neck stiffness      Patient is a 25 y.o. female presenting with headaches. The history is provided by the patient.   Headache   This is a recurrent problem. Episode onset: 1 month ago. Episode frequency: every few days. The problem has not changed since onset.The headache is aggravated by nothing. The pain is located in the left unilateral and frontal region. The quality of the pain is described as sharp and throbbing. The pain is moderate. Associated symptoms include nausea and vomiting. Pertinent negatives include no anorexia, no fever, no malaise/fatigue, no chest pressure, no near-syncope, no orthopnea, no palpitations, no syncope, no shortness of breath, no weakness, no tingling, no dizziness and no visual change. She has tried acetaminophen and NSAIDs for the symptoms. The treatment provided no relief.        Past Medical History   Diagnosis Date   ??? Sickle-cell disease, unspecified trait        Past Surgical History   Procedure Date   ??? Hx heent      wisdom teeth extraction         Family History   Problem Relation Age of Onset   ??? Diabetes Mother    ??? Hypertension Mother    ??? Asthma Mother    ??? Migraines Mother    ??? Hypertension Father    ??? Alcohol abuse Father    ??? Asthma Brother         History     Social History   ??? Marital Status: Single     Spouse Name: N/A     Number of Children: N/A   ??? Years of Education: N/A     Occupational History   ??? Not on file.     Social History Main Topics   ??? Smoking status: Current Everyday Smoker -- 0.5 packs/day for 10 years   ??? Smokeless tobacco: Not on file   ??? Alcohol Use: No    ??? Drug Use: No   ??? Sexually Active: Yes -- Female partner(s)     Other Topics Concern   ??? Not on file     Social History Narrative   ??? No narrative on file                  ALLERGIES: Hydrocodone      Review of Systems   Constitutional: Negative for fever, chills, malaise/fatigue, diaphoresis and appetite change.   HENT: Negative for neck pain, neck stiffness and sinus pressure.    Eyes: Negative.    Respiratory: Negative.  Negative for shortness of breath.    Cardiovascular: Negative.  Negative for palpitations, orthopnea, syncope and near-syncope.   Gastrointestinal: Positive for nausea and vomiting. Negative for anorexia.   Genitourinary: Negative.    Musculoskeletal: Negative.    Skin: Negative.    Neurological: Positive for headaches. Negative for dizziness, tingling and weakness.   Hematological: Negative.    Psychiatric/Behavioral: Negative.        Filed Vitals:    11/24/10 2014   BP: 116/65   Pulse: 78  Temp: 98.6 ??F (37 ??C)   Resp: 16   Height: 5\' 7"  (1.702 m)   Weight: 125 lb (56.7 kg)   SpO2: 100%            Physical Exam   [nursing notereviewed.  Constitutional: She is oriented to person, place, and time. She appears well-developed and well-nourished.   HENT:   Head: Normocephalic and atraumatic.   Right Ear: External ear normal.   Left Ear: External ear normal.   Mouth/Throat: Oropharynx is clear and moist. No oropharyngeal exudate.   Eyes: Conjunctivae and EOM are normal. Pupils are equal, round, and reactive to light. Right eye exhibits no discharge. Left eye exhibits no discharge. No scleral icterus.   Neck: Normal range of motion. Neck supple. No tracheal deviation present. No thyromegaly present.   Cardiovascular: Normal rate, regular rhythm and normal heart sounds.    No murmur heard.  Pulmonary/Chest: Effort normal and breath sounds normal. No respiratory distress. She has no wheezes. She has no rales. She exhibits no tenderness.    Abdominal: Soft. She exhibits no distension. No tenderness. She has no rebound and no guarding.   Musculoskeletal: Normal range of motion. She exhibits no edema and no tenderness.   Lymphadenopathy:     She has no cervical adenopathy.   Neurological: She is alert and oriented to person, place, and time. No cranial nerve deficit. Coordination normal.   Skin: Skin is warm and dry. No rash noted. No erythema. No pallor.   Psychiatric: She has a normal mood and affect. Her behavior is normal. Judgment and thought content normal.        MDM    Procedures

## 2010-11-25 LAB — METABOLIC PANEL, COMPREHENSIVE
A-G Ratio: 1 — ABNORMAL LOW (ref 1.1–2.2)
ALT (SGPT): 37 U/L (ref 12–78)
AST (SGOT): 18 U/L (ref 15–37)
Albumin: 3.8 g/dL (ref 3.5–5.0)
Alk. phosphatase: 104 U/L (ref 50–136)
Anion gap: 5 mmol/L (ref 5–15)
BUN/Creatinine ratio: 11 — ABNORMAL LOW (ref 12–20)
BUN: 9 MG/DL (ref 6–20)
Bilirubin, total: 0.3 MG/DL (ref 0.2–1.0)
CO2: 27 MMOL/L (ref 21–32)
Calcium: 9 MG/DL (ref 8.5–10.1)
Chloride: 100 MMOL/L (ref 97–108)
Creatinine: 0.8 MG/DL (ref 0.6–1.3)
GFR est AA: >60 mL/min/{1.73_m2} (ref >60)
GFR est non-AA: >60 mL/min/{1.73_m2} (ref >60)
Globulin: 3.8 g/dL (ref 2.0–4.0)
Glucose: 85 MG/DL (ref 65–100)
Potassium: 3.7 MMOL/L (ref 3.5–5.1)
Protein, total: 7.6 g/dL (ref 6.4–8.2)
Sodium: 132 MMOL/L — ABNORMAL LOW (ref 136–145)

## 2010-11-25 LAB — URINALYSIS W/ REFLEX CULTURE
Bacteria: NEGATIVE /HPF
Bilirubin: NEGATIVE
Glucose: NEGATIVE MG/DL
Ketone: NEGATIVE MG/DL
Nitrites: NEGATIVE
Specific gravity: 1.024 (ref 1.003–1.030)
Urobilinogen: 1 EU/DL (ref 0.2–1.0)
pH (UA): 8 (ref 5.0–8.0)

## 2010-11-25 LAB — CBC WITH AUTOMATED DIFF
ABS. BASOPHILS: 0.1 10*3/uL (ref 0.0–0.1)
ABS. EOSINOPHILS: 0.2 10*3/uL (ref 0.0–0.4)
ABS. LYMPHOCYTES: 3.2 10*3/uL (ref 0.8–3.5)
ABS. MONOCYTES: 0.5 10*3/uL (ref 0.0–1.0)
ABS. NEUTROPHILS: 5.1 10*3/uL (ref 1.8–8.0)
BASOPHILS: 1 % (ref 0–1)
EOSINOPHILS: 2 % (ref 0–7)
HCT: 34.8 % — ABNORMAL LOW (ref 35.0–47.0)
HGB: 12.3 g/dL (ref 11.5–16.0)
LYMPHOCYTES: 35 % (ref 12–49)
MCH: 30.1 PG (ref 26.0–34.0)
MCHC: 35.3 g/dL (ref 30.0–36.5)
MCV: 85.1 FL (ref 80.0–99.0)
MONOCYTES: 5 % (ref 5–13)
NEUTROPHILS: 57 % (ref 32–75)
PLATELET: 250 10*3/uL (ref 150–400)
RBC: 4.09 M/uL (ref 3.80–5.20)
RDW: 14.9 % — ABNORMAL HIGH (ref 11.5–14.5)
WBC: 9 10*3/uL (ref 3.6–11.0)

## 2010-11-25 LAB — HCG URINE, QL. - POC: Pregnancy test,urine (POC): NEGATIVE

## 2010-11-25 MED ORDER — BUTALBITAL-ACETAMINOPHEN-CAFFEINE 50 MG-325 MG-40 MG TAB
50-325-40 mg | ORAL_TABLET | ORAL | Status: DC | PRN
Start: 2010-11-25 — End: 2011-01-22

## 2010-11-25 MED ORDER — KETOROLAC TROMETHAMINE 30 MG/ML INJECTION
30 mg/mL (1 mL) | INTRAMUSCULAR | Status: AC
Start: 2010-11-25 — End: 2010-11-24
  Administered 2010-11-25: 03:00:00 via INTRAVENOUS

## 2010-11-25 MED ORDER — METHYLPREDNISOLONE SODIUM SUCC 125 MG/2 ML IJ SOLR
125 mg/2 mL | INTRAMUSCULAR | Status: AC
Start: 2010-11-25 — End: 2010-11-24
  Administered 2010-11-25: 03:00:00 via INTRAVENOUS

## 2010-11-25 MED ORDER — ONDANSETRON (PF) 4 MG/2 ML INJECTION
4 mg/2 mL | INTRAMUSCULAR | Status: AC
Start: 2010-11-25 — End: 2010-11-24
  Administered 2010-11-25: 04:00:00 via INTRAVENOUS

## 2010-11-25 MED ADMIN — sodium chloride 0.9 % bolus infusion 1,000 mL: INTRAVENOUS | @ 03:00:00 | NDC 00409798309

## 2010-11-25 MED ADMIN — magnesium sulfate 2 g/50 ml IVPB (premix or compounded): INTRAVENOUS | @ 05:00:00 | NDC 00409672924

## 2010-11-25 NOTE — ED Notes (Signed)
I have reviewed discharge instructions with the patient.  The patient verbalized understanding.  Rx for fioricet given to patient.  Patient discharged via wheelchair to care of family at door without incident.

## 2010-11-25 NOTE — ED Provider Notes (Signed)
I was personally available for consultation in the emergency department.  I have reviewed the chart and agree with the documentation recorded by the [facility], including the assessment, treatment plan, and disposition.  Jose Corvin H Mabel Roll, MD

## 2010-11-25 NOTE — ED Notes (Signed)
Pt sts she is feeling better will dc with firocet

## 2010-11-27 LAB — HCG URINE, QL. - POC: Pregnancy test,urine (POC): NEGATIVE

## 2010-11-28 NOTE — ED Notes (Signed)
Dr. McGee reviewed discharge instructions with the patient.  The patient verbalized understanding.  Pt ambulatory upon discharge with friend.  Instructions in hand.  Instructions given by Dr. McGee.

## 2010-11-28 NOTE — ED Notes (Signed)
MD at bedside.
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
## 2010-11-28 NOTE — ED Notes (Signed)
Assumed care of pt.  Assessment completed.

## 2010-11-29 LAB — HCG URINE, QL: HCG urine, QL: NEGATIVE

## 2010-11-29 MED ORDER — OXYCODONE-ACETAMINOPHEN 5 MG-325 MG TAB
5-325 mg | ORAL_TABLET | ORAL | Status: AC | PRN
Start: 2010-11-29 — End: 2010-12-05

## 2010-11-29 MED ORDER — OXYCODONE-ACETAMINOPHEN 5 MG-325 MG TAB
5-325 mg | ORAL | Status: AC
Start: 2010-11-29 — End: 2010-11-28
  Administered 2010-11-29: 02:00:00 via ORAL

## 2011-01-22 MED ORDER — TRAMADOL 50 MG TAB
50 mg | ORAL_TABLET | Freq: Four times a day (QID) | ORAL | Status: AC | PRN
Start: 2011-01-22 — End: 2011-02-01

## 2011-01-22 NOTE — ED Provider Notes (Addendum)
The history is provided by the patient. No language interpreter was used.      Pt is 25 yof ambulatory to ER with friend with x 2  C/o today. Pt is c/o  L upper back pain that pt states is worse with moving neck and with movement. Pt denies known recent injury/trauma/fall. Pt states no heavy lifting at home but does pick up her child. Pt states experiences numbness/tingling in LLE when crossing her legs and with turning head certain ways. Pt states upper back pain is in area of mole that is on her back.     Pt also c/o mole to L scapular region of back ongoing x 3 years. Pt states site began as + "tiny dot" and has increased in size. Pt states PMD has eval site and has advised that PMD would contact [facility] for f/u but [facility] never contacted her.     Pt denies fever/chills, sore throat, ear pain, runny nose, h/a, dizziness,change in vision,  incontinence stool/urine, blood in stool/urine, vag d/c, urinary s/s, ABD pain,lower back pain, cough, CP, SOB, palpitations.     Pt states no improvement with motrin that she took x 2 days ago.    Pt has + medicaid.    Pt with no other c/o or concerns today. Pain is 9/10 scale.    Pt PMD Dr Demetrios Isaacs    Pt with allergies: hydrocodone, IVP dye    Pt states + tobacco (1/2 ppd), - alcohol  Written by Mickey Farber, ED Scribe, as dictated by PA Marin Shutter.    Past Medical History   Diagnosis Date   ??? Sickle-cell disease, unspecified trait        Past Surgical History   Procedure Date   ??? Hx heent      wisdom teeth extraction         Family History   Problem Relation Age of Onset   ??? Diabetes Mother    ??? Hypertension Mother    ??? Asthma Mother    ??? Migraines Mother    ??? Hypertension Father    ??? Alcohol abuse Father    ??? Asthma Brother         History     Social History   ??? Marital Status: Single     Spouse Name: N/A     Number of Children: N/A   ??? Years of Education: N/A     Occupational History   ??? Not on file.     Social History Main Topics    ??? Smoking status: Current Everyday Smoker -- 0.5 packs/day for 10 years   ??? Smokeless tobacco: Not on file   ??? Alcohol Use: No   ??? Drug Use: No   ??? Sexually Active: Yes -- Female partner(s)     Other Topics Concern   ??? Not on file     Social History Narrative   ??? No narrative on file                  ALLERGIES: Hydrocodone and Iv dye, iodine containing contrast media      Review of Systems   Constitutional: Negative for fever and chills.        [Well hydrated, in no apparent distress  HENT: Negative for ear pain, sore throat and rhinorrhea.    Eyes: Negative for visual disturbance.   Respiratory: Negative for cough and shortness of breath.    Cardiovascular: Negative for chest pain and palpitations.   Gastrointestinal: Negative for  nausea, vomiting and blood in stool.        [Denies incontinence stool  Genitourinary: Negative for dysuria, hematuria, vaginal bleeding, vaginal discharge and vaginal pain.        [Denies incontinence urine  Musculoskeletal: Positive for back pain.        [+ L upper back pain x 4 days in area of mole on L scapular region of back, denies lower back pain  Skin:        [+ mole to L post scapular site x 3 years with + increasing in size  Neurological: Negative for dizziness and headaches.        [+ A & O x 3 during exam   [all other systems reviewed and are negative        Filed Vitals:    01/22/11 1451   BP: 135/70   Pulse: 98   Temp: 98.6 ??F (37 ??C)   Resp: 16   Height: 5\' 7"  (1.702 m)   Weight: 125 lb 10.6 oz (57 kg)   SpO2: 100%            Physical Exam   [nursing notereviewed.  Constitutional: She is oriented to person, place, and time. She appears well-developed and well-nourished. No distress.        25 yo female   HENT:   Head: Normocephalic and atraumatic.   Eyes: Conjunctivae are normal. Right eye exhibits no discharge. Left eye exhibits no discharge.   Musculoskeletal:         BACK: Normal spinal curvatures. No step off or deformity. No midline TTP. FROM of the UE and LE without pain or difficulty. Strength of the UE and LE 5/5 and equal bilaterally. Ambulatory without difficulty.    Neurological: She is alert and oriented to person, place, and time.   Skin: Skin is warm and dry. She is not diaphoretic.        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    Psychiatric: She has a normal mood and affect. Her behavior is normal.        MDM     Amount and/or Complexity of Data Reviewed:    Discuss the patient with another provider:  No      Procedures  3:30 PM  Pt has been reexamined Pt condition is unchanged. RX meds explained to pt Pt advised to f/u with Dr Lorna Dibble x 1 week or [facility] dermatology x 1 week. Pt questions answered. Pt counseled re: dx, tx, rx and f/u with understanding Pt is ready to go home.Written by Mickey Farber, ED Scribe, as dictated by PA Marin Shutter.    I was personally available for consultation in the emergency department.  I have reviewed the chart and agree with the documentation recorded by the [facility], including the assessment, treatment plan, and disposition.  Deloria Lair, MD

## 2011-01-22 NOTE — ED Notes (Signed)
Pt discharged by the provider

## 2011-01-22 NOTE — ED Notes (Signed)
Assumed care of patient. Patient placed in position of comfort. Call bell in reach.

## 2012-01-13 ENCOUNTER — Inpatient Hospital Stay
Admit: 2012-01-13 | Discharge: 2012-01-14 | Discharge status: Against Medical Advice | Payer: Self-pay | Attending: Internal Medicine | Admitting: Internal Medicine

## 2012-01-13 DIAGNOSIS — K7200 Acute and subacute hepatic failure without coma: Secondary | ICD-10-CM

## 2012-01-13 LAB — URINALYSIS W/ REFLEX CULTURE
Bacteria: NEGATIVE /HPF
Bilirubin: NEGATIVE
Glucose: NEGATIVE MG/DL
Leukocyte Esterase: NEGATIVE
Nitrites: NEGATIVE
Protein: 100 MG/DL — AB
Specific gravity: 1.025 (ref 1.003–1.030)
Urobilinogen: 0.2 EU/DL (ref 0.2–1.0)
pH (UA): 5 (ref 5.0–8.0)

## 2012-01-13 LAB — CBC WITH AUTOMATED DIFF
ABS. BASOPHILS: 0 10*3/uL (ref 0.0–0.1)
ABS. EOSINOPHILS: 0 10*3/uL (ref 0.0–0.4)
ABS. LYMPHOCYTES: 2.8 10*3/uL (ref 0.8–3.5)
ABS. MONOCYTES: 2 10*3/uL — ABNORMAL HIGH (ref 0.0–1.0)
ABS. NEUTROPHILS: 11.7 10*3/uL — ABNORMAL HIGH (ref 1.8–8.0)
BAND NEUTROPHILS: 6 % (ref 0–6)
BASOPHILS: 0 % (ref 0–1)
EOSINOPHILS: 0 % (ref 0–7)
HCT: 49.3 % — ABNORMAL HIGH (ref 35.0–47.0)
HGB: 17 g/dL — ABNORMAL HIGH (ref 11.5–16.0)
LYMPHOCYTES: 17 % (ref 12–49)
MCH: 28.9 PG (ref 26.0–34.0)
MCHC: 34.5 g/dL (ref 30.0–36.5)
MCV: 83.7 FL (ref 80.0–99.0)
MONOCYTES: 12 % (ref 5–13)
NEUTROPHILS: 65 % (ref 32–75)
PLATELET: 335 10*3/uL (ref 150–400)
RBC: 5.89 M/uL — ABNORMAL HIGH (ref 3.80–5.20)
RDW: 15.1 % — ABNORMAL HIGH (ref 11.5–14.5)
WBC: 16.5 10*3/uL — ABNORMAL HIGH (ref 3.6–11.0)

## 2012-01-13 LAB — METABOLIC PANEL, COMPREHENSIVE
A-G Ratio: 0.8 — ABNORMAL LOW (ref 1.1–2.2)
ALT (SGPT): 341 U/L — ABNORMAL HIGH (ref 12–78)
AST (SGOT): 154 U/L — ABNORMAL HIGH (ref 15–37)
Albumin: 4.9 g/dL (ref 3.5–5.0)
Alk. phosphatase: 180 U/L — ABNORMAL HIGH (ref 50–136)
Anion gap: 11 mmol/L (ref 5–15)
BUN/Creatinine ratio: 15 (ref 12–20)
BUN: 15 MG/DL (ref 6–20)
Bilirubin, total: 0.5 MG/DL (ref 0.2–1.0)
CO2: 25 MMOL/L (ref 21–32)
Calcium: 10.5 MG/DL — ABNORMAL HIGH (ref 8.5–10.1)
Chloride: 98 MMOL/L (ref 97–108)
Creatinine: 0.97 MG/DL (ref 0.45–1.15)
GFR est AA: >60 mL/min/{1.73_m2} (ref >60)
GFR est non-AA: >60 mL/min/{1.73_m2} (ref >60)
Globulin: 6.1 g/dL — ABNORMAL HIGH (ref 2.0–4.0)
Glucose: 86 MG/DL (ref 65–100)
Potassium: 5.9 MMOL/L — ABNORMAL HIGH (ref 3.5–5.1)
Protein, total: 11 g/dL — ABNORMAL HIGH (ref 6.4–8.2)
Sodium: 134 MMOL/L — ABNORMAL LOW (ref 136–145)

## 2012-01-13 LAB — HCG URINE, QL. - POC: Pregnancy test,urine (POC): NEGATIVE

## 2012-01-13 LAB — LIPASE: Lipase: 185 U/L (ref 73–393)

## 2012-01-13 MED ORDER — KETOROLAC TROMETHAMINE 30 MG/ML INJECTION
30 mg/mL (1 mL) | INTRAMUSCULAR | Status: AC
Start: 2012-01-13 — End: 2012-01-13
  Administered 2012-01-14: 01:00:00 via INTRAVENOUS

## 2012-01-13 MED ORDER — ONDANSETRON (PF) 4 MG/2 ML INJECTION
4 mg/2 mL | INTRAMUSCULAR | Status: AC
Start: 2012-01-13 — End: 2012-01-13
  Administered 2012-01-13: 23:00:00 via INTRAVENOUS

## 2012-01-13 MED ADMIN — sodium chloride 0.9 % bolus infusion 1,000 mL: INTRAVENOUS | @ 23:00:00 | NDC 00409798309

## 2012-01-13 MED FILL — SODIUM CHLORIDE 0.9 % IV: INTRAVENOUS | Qty: 1000

## 2012-01-13 MED FILL — ONDANSETRON (PF) 4 MG/2 ML INJECTION: 4 mg/2 mL | INTRAMUSCULAR | Qty: 2

## 2012-01-13 NOTE — ED Provider Notes (Addendum)
Patient is a 26 y.o. female presenting with abdominal pain and vomiting. The history is provided by the patient and medical records. No language interpreter was used.   Abdominal Pain   This is a new problem. The current episode started yesterday. The problem occurs constantly. The problem has not changed since onset.The pain is associated with vomiting and eating. The pain is located in the RUQ. The quality of the pain is pressure-like. Associated symptoms include nausea and vomiting. Pertinent negatives include no diarrhea. The pain is worsened by vomiting. The pain is relieved by nothing. The patient's surgical history non-contributory.  Vomiting   This is a new problem. The current episode started yesterday. The problem occurs 2 to 4 times per day. The problem has not changed since onset.There has been no fever. Associated symptoms include abdominal pain. Pertinent negatives include no diarrhea. The patient is not pregnant.     Pt c/o RUQ abd pain since yesterday, "egg burps", no fever or diarrhea. Mother with cholecystectomy but no personal history of same.    Past Medical History   Diagnosis Date   ??? Sickle-cell disease, unspecified trait        Past Surgical History   Procedure Date   ??? Hx heent      wisdom teeth extraction         Family History   Problem Relation Age of Onset   ??? Diabetes Mother    ??? Hypertension Mother    ??? Asthma Mother    ??? Migraines Mother    ??? Hypertension Father    ??? Alcohol abuse Father    ??? Asthma Brother         History     Social History   ??? Marital Status: SINGLE     Spouse Name: N/A     Number of Children: N/A   ??? Years of Education: N/A     Occupational History   ??? Not on file.     Social History Main Topics   ??? Smoking status: Current Everyday Smoker -- 0.5 packs/day for 10 years   ??? Smokeless tobacco: Not on file   ??? Alcohol Use: No   ??? Drug Use: No   ??? Sexually Active: Yes -- Female partner(s)     Other Topics Concern   ??? Not on file     Social History Narrative   ??? No  narrative on file                  ALLERGIES: Hydrocodone and Iodinated contrast media - iv dye      Review of Systems   Gastrointestinal: Positive for nausea, vomiting and abdominal pain. Negative for diarrhea.   All other systems reviewed and are negative.        Filed Vitals:    01/13/12 1545 01/13/12 1647   BP: 128/94 130/87   Pulse: 130    Temp: 97.6 ??F (36.4 ??C)    Resp: 32 18   Height: 5\' 6"  (1.676 m)    Weight: 56.7 kg (125 lb)    SpO2: 100% 98%            Physical Exam   Nursing note and vitals reviewed.  Constitutional: She is oriented to person, place, and time. She appears well-developed and well-nourished. No distress.        Slightly pale appearing Black female, sleeping in bed, awakens easily     HENT:   Head: Normocephalic and atraumatic.   Right Ear:  External ear normal.   Left Ear: External ear normal.   Nose: Nose normal.   Mouth/Throat: Oropharynx is clear and moist. No oropharyngeal exudate.   Eyes: Conjunctivae and EOM are normal. Pupils are equal, round, and reactive to light. Right eye exhibits no discharge. Left eye exhibits no discharge. No scleral icterus.   Neck: Normal range of motion. Neck supple. No JVD present. No tracheal deviation present. No thyromegaly present.   Cardiovascular: Normal rate, regular rhythm and normal heart sounds.    No murmur heard.  Pulmonary/Chest: Effort normal and breath sounds normal. No stridor. No respiratory distress. She has no wheezes. She has no rales. She exhibits no tenderness.   Abdominal: Soft. Bowel sounds are normal. She exhibits no distension and no mass. There is tenderness. There is no rebound and no guarding.        Mild tenderness suprapubic and RUQ, neg Murphy's, neg Rovsings, neg CVAT   Musculoskeletal: Normal range of motion. She exhibits no edema and no tenderness.   Lymphadenopathy:     She has no cervical adenopathy.   Neurological: She is alert and oriented to person, place, and time. No cranial nerve deficit. Coordination normal.    Skin: Skin is warm and dry. No rash noted. She is not diaphoretic. No erythema. No pallor.   Psychiatric: She has a normal mood and affect. Her behavior is normal. Judgment and thought content normal.        MDM    Procedures    Pt re-evaluated. Still c/o pain and nausea, weakness unable to tolerate po.     10:42 PM     I spoke with Dr. Clancy Gourd, Consult for Hospitalist. Discussed available diagnostic tests and clinical findings.She is in agreement with care plans as outlined. She will admit for IVF and po advancement, hepatitis workup.  Samuel Bouche B. Lenox Ponds, MD

## 2012-01-13 NOTE — ED Notes (Signed)
Pt reports vomiting and diarrhea starting this morning.  Pt states, "I have these egg burps, I burp and it tastes like eggs, and when I threw up this morning, none of the food was digested."

## 2012-01-13 NOTE — ED Notes (Signed)
Verbal shift change report given to Dava Najjar RN (oncoming nurse) by Franchot Mimes RN (offgoing nurse).  Report given with SBAR, ED Summary, Procedure Summary, Intake/Output, MAR and Recent Results.

## 2012-01-13 NOTE — ED Notes (Signed)
Verbal shift change report given to Tiffany RN (oncoming nurse) by Shelly RN (offgoing nurse).  Report given with SBAR, ED Summary and Procedure Summary.

## 2012-01-13 NOTE — ED Notes (Signed)
Bedside and Verbal shift change report given to Tiffany, Charity fundraiser (Cabin crew) by Vance Gather, RN (offgoing nurse).  Report given with SBAR, ED Summary, MAR and Recent Results.

## 2012-01-13 NOTE — ED Notes (Signed)
Tele hospitalist at bedside.

## 2012-01-14 LAB — METABOLIC PANEL, COMPREHENSIVE
A-G Ratio: 0.8 — ABNORMAL LOW (ref 1.1–2.2)
ALT (SGPT): 273 U/L — ABNORMAL HIGH (ref 12–78)
AST (SGOT): 119 U/L — ABNORMAL HIGH (ref 15–37)
Albumin: 3.9 g/dL (ref 3.5–5.0)
Alk. phosphatase: 148 U/L — ABNORMAL HIGH (ref 50–136)
Anion gap: 10 mmol/L (ref 5–15)
BUN/Creatinine ratio: 15 (ref 12–20)
BUN: 14 MG/DL (ref 6–20)
Bilirubin, total: 0.4 MG/DL (ref 0.2–1.0)
CO2: 24 MMOL/L (ref 21–32)
Calcium: 9.2 MG/DL (ref 8.5–10.1)
Chloride: 102 MMOL/L (ref 97–108)
Creatinine: 0.94 MG/DL (ref 0.45–1.15)
GFR est AA: >60 mL/min/{1.73_m2} (ref >60)
GFR est non-AA: >60 mL/min/{1.73_m2} (ref >60)
Globulin: 5 g/dL — ABNORMAL HIGH (ref 2.0–4.0)
Glucose: 84 MG/DL (ref 65–100)
Potassium: 5.2 MMOL/L — ABNORMAL HIGH (ref 3.5–5.1)
Protein, total: 8.9 g/dL — ABNORMAL HIGH (ref 6.4–8.2)
Sodium: 136 MMOL/L (ref 136–145)

## 2012-01-14 LAB — CBC W/O DIFF
HCT: 34.6 % — ABNORMAL LOW (ref 35.0–47.0)
HGB: 11.7 g/dL (ref 11.5–16.0)
MCH: 28.9 PG (ref 26.0–34.0)
MCHC: 33.8 g/dL (ref 30.0–36.5)
MCV: 85.4 FL (ref 80.0–99.0)
PLATELET: 299 10*3/uL (ref 150–400)
RBC: 4.05 M/uL (ref 3.80–5.20)
RDW: 14.7 % — ABNORMAL HIGH (ref 11.5–14.5)
WBC: 10.2 10*3/uL (ref 3.6–11.0)

## 2012-01-14 LAB — METABOLIC PANEL, BASIC
Anion gap: 8 mmol/L (ref 5–15)
BUN/Creatinine ratio: 17 (ref 12–20)
BUN: 15 MG/DL (ref 6–20)
CO2: 22 MMOL/L (ref 21–32)
Calcium: 8.9 MG/DL (ref 8.5–10.1)
Chloride: 104 MMOL/L (ref 97–108)
Creatinine: 0.9 MG/DL (ref 0.45–1.15)
GFR est AA: >60 mL/min/{1.73_m2} (ref >60)
GFR est non-AA: >60 mL/min/{1.73_m2} (ref >60)
Glucose: 95 MG/DL (ref 65–100)
Potassium: 4 MMOL/L (ref 3.5–5.1)
Sodium: 134 MMOL/L — ABNORMAL LOW (ref 136–145)

## 2012-01-14 LAB — HEPATIC FUNCTION PANEL
A-G Ratio: 0.8 — ABNORMAL LOW (ref 1.1–2.2)
ALT (SGPT): 215 U/L — ABNORMAL HIGH (ref 12–78)
AST (SGOT): 89 U/L — ABNORMAL HIGH (ref 15–37)
Albumin: 3.3 g/dL — ABNORMAL LOW (ref 3.5–5.0)
Alk. phosphatase: 134 U/L (ref 50–136)
Bilirubin, direct: 0.1 MG/DL (ref 0.0–0.2)
Bilirubin, total: 0.6 MG/DL (ref 0.2–1.0)
Globulin: 4.4 g/dL — ABNORMAL HIGH (ref 2.0–4.0)
Protein, total: 7.7 g/dL (ref 6.4–8.2)

## 2012-01-14 LAB — EKG, 12 LEAD, INITIAL
Atrial Rate: 92 {beats}/min
Calculated P Axis: 78 degrees
Calculated R Axis: 90 degrees
Calculated T Axis: 20 degrees
Diagnosis: NORMAL
P-R Interval: 136 ms
Q-T Interval: 350 ms
QRS Duration: 66 ms
QTC Calculation (Bezet): 432 ms
Ventricular Rate: 92 {beats}/min

## 2012-01-14 MED ORDER — KETOROLAC TROMETHAMINE 30 MG/ML INJECTION
30 mg/mL (1 mL) | Freq: Four times a day (QID) | INTRAMUSCULAR | Status: DC | PRN
Start: 2012-01-14 — End: 2012-01-14
  Administered 2012-01-14: 05:00:00 via INTRAVENOUS

## 2012-01-14 MED ORDER — METRONIDAZOLE 250 MG TAB
250 mg | Freq: Three times a day (TID) | ORAL | Status: DC
Start: 2012-01-14 — End: 2012-01-14

## 2012-01-14 MED ADMIN — sodium chloride (NS) 0.9 % flush: @ 02:00:00 | NDC 58016499501

## 2012-01-14 MED ADMIN — ondansetron (ZOFRAN) injection 4 mg: INTRAVENOUS | @ 05:00:00 | NDC 00781301072

## 2012-01-14 MED ADMIN — 0.9% sodium chloride infusion: INTRAVENOUS | @ 05:00:00 | NDC 00409798348

## 2012-01-14 MED ADMIN — sodium chloride 0.9 % bolus infusion 1,000 mL: INTRAVENOUS | @ 01:00:00 | NDC 00409798309

## 2012-01-14 MED FILL — SODIUM CHLORIDE 0.9 % IV: INTRAVENOUS | Qty: 1000

## 2012-01-14 MED FILL — KETOROLAC TROMETHAMINE 30 MG/ML INJECTION: 30 mg/mL (1 mL) | INTRAMUSCULAR | Qty: 1

## 2012-01-14 MED FILL — LOVENOX 40 MG/0.4 ML SUBCUTANEOUS SYRINGE: 40 mg/0.4 mL | SUBCUTANEOUS | Qty: 0.4

## 2012-01-14 MED FILL — SALINE FLUSH INJECTION SYRINGE: INTRAMUSCULAR | Qty: 10

## 2012-01-14 MED FILL — ONDANSETRON (PF) 4 MG/2 ML INJECTION: 4 mg/2 mL | INTRAMUSCULAR | Qty: 2

## 2012-01-14 MED FILL — METRONIDAZOLE 250 MG TAB: 250 mg | ORAL | Qty: 2

## 2012-01-14 MED FILL — LORAZEPAM 2 MG/ML IJ SOLN: 2 mg/mL | INTRAMUSCULAR | Qty: 1

## 2012-01-14 NOTE — Progress Notes (Signed)
Location: - [address]  Attn.: Gabriel Carina  DOB: [birth year] / Age: 26  MR#: [id number] / Admit#: [id number]  Pt. First Name: Ariel  Pt. Last Name: Dewayne Shorter [facility]  [address] [place], [place]  [address]                        Case Management - Progress Note  Initial Open Date: 01/14/2012  Case Manager: Luz Lex    Initial Open Date:  Social Worker:  Expected Date of Discharge:  Transferred From:  ECF Bed Held Until:  Bed Held By:  Power of Attorney:  POA/Guardian/Conservator Capacity:  Primary Caregiver:  Living Arrangements:  Source of Income:  Payee:  Psychosocial History:  Cultural/Religious/Language Issues:  Education Level:  ADLS/Current Living Arrangements Issues:  Past Providers:  Will patient perform self care at discharge?  Anticipated Discharge Disposition Goal:  Assessment/Plan:   01/14/2012 06:39P  Pt eloped prior to CM assessment of discharge planning.  Luz Lex, BSW  [phone]  Resources at Discharge:  Service Providers at Discharge:  Dictating Provider:  Robyne Peers

## 2012-01-14 NOTE — H&P (Signed)
History & Physical Dictation Template      NAME: Ashley Mills   Date of Service  01/14/2012   MRN  [id number]   Date of Birth 1986-04-08   AGE: 26 y.o.   ROOM: ER10/10     HPI  The patient Ashley Mills is a 26 y.o. female that is admitted with ACUTE HEPATITIS AND ABD PAIN.  PT STARTED WITH RUQ PAIN AND N/V X 1 DAY AGO.  IT GOT PROGRESSIVELY WORSE.  + SYNCOPE TODAY.  + ABNL LABS.  DENIES IV DRUG ABUSE AND NO ONE ELSE SICK IN HOUSEHOLD.  NEVER HAPPENED BEFORE.  SHE WAS TIRED AND NOT ANSWERING QUESTIONS VERY WELL.  USUALLY ONE ANSWERS.  DENIES RECENT UNPROTECTED SEX.    THE PATIENT WAS SEEN VIA REMOTE PRESENCE WITH RN PRESENT.    ASSESSMENT/ PLAN        *Hepatitis (01/14/2012)-? ETIOLOGY.  WILL CHECK HEP PROFILE.  IVF.  U/S WAS NEGATIVE     Nausea & vomiting (01/14/2012)     Syncope and collapse (01/14/2012)-PROBABLY 2 #1.  CHECK ORTHOSTATICS.  IVF     Abdominal pain, acute, right upper quadrant (01/14/2012)-MAY NEED FURTHER HIDA SCAN OR SX EVAL     Sickle cell trait (01/14/2012)    +TRICHOMONAS-WILL GIVE FLAGYL.  MAY NEED HIV WORKUP       Chart reviewed   Pt seen and examined   Please see full dictation for additional        BMI: Body mass index is 20.18 kg/(m^2).  Visit Vitals   Item Reading   ??? BP 107/52   ??? Pulse 97   ??? Temp 97.6 ??F (36.4 ??C)   ??? Resp 18   ??? Ht 5\' 6"  (1.676 m)   ??? Wt 125 lb   ??? BMI 20.18 kg/m2   ??? SpO2 97%       Pertinent Physical Exam Findings: NONE  ________________________________________________________________________  family history includes Alcohol abuse in her father; Asthma in her brother and mother; Diabetes in her mother; Hypertension in her father and mother; and Migraines in her mother.  ________________________________________________________________________  Present on Admission:   ???Nausea & vomiting  ???Hepatitis  ???Syncope and collapse  ???Abdominal pain, acute, right upper quadrant  ???Sickle cell trait  ________________________________________________________________________   ________________________________________________________________________  Recent Results (from the past 24 hour(s))   URINALYSIS W/ REFLEX CULTURE    Collection Time    01/13/12  6:37 PM       Component Value Range    Color YELLOW      Appearance HAZY      Specific gravity 1.025  1.003 - 1.030      pH 5.0  5.0 - 8.0      Protein 100 (*) NEGATIVE MG/DL    Glucose NEGATIVE   NEGATIVE MG/DL    Ketone TRACE (*) NEGATIVE MG/DL    Bilirubin NEGATIVE   NEGATIVE    Blood SMALL (*) NEGATIVE    Urobilinogen 0.2  0.2 - 1.0 EU/DL    Nitrites NEGATIVE   NEGATIVE    Leukocyte Esterase NEGATIVE   NEGATIVE    WBC 5-10  0 - 4 /HPF    RBC 5-10  0 - 5 /HPF    Epithelial cells 10-20  0 - 5 /LPF    Bacteria NEGATIVE   NEGATIVE /HPF    UA:UC IF INDICATED URINE CULTURE ORDERED      Mucus TRACE (*) NEGATIVE /LPF    Trichomonas PRESENT (*) NEGATIVE   CBC  WITH AUTOMATED DIFF    Collection Time    01/13/12  6:42 PM       Component Value Range    WBC 16.5 (*) 3.6 - 11.0 K/uL    RBC 5.89 (*) 3.80 - 5.20 M/uL    HGB 17.0 (*) 11.5 - 16.0 g/dL    HCT 16.1 (*) 09.6 - 47.0 %    MCV 83.7  80.0 - 99.0 FL    MCH 28.9  26.0 - 34.0 PG    MCHC 34.5  30.0 - 36.5 g/dL    RDW 04.5 (*) 40.9 - 14.5 %    PLATELET 335  150 - 400 K/uL    NEUTROPHILS 65  32 - 75 %    BANDS 6  0 - 6 %    LYMPHOCYTES 17  12 - 49 %    MONOCYTES 12  5 - 13 %    EOSINOPHILS 0  0 - 7 %    BASOPHILS 0  0 - 1 %    ABS. NEUTROPHILS 11.7 (*) 1.8 - 8.0 K/UL    ABS. LYMPHOCYTES 2.8  0.8 - 3.5 K/UL    ABS. MONOCYTES 2.0 (*) 0.0 - 1.0 K/UL    ABS. EOSINOPHILS 0.0  0.0 - 0.4 K/UL    ABS. BASOPHILS 0.0  0.0 - 0.1 K/UL    RBC COMMENTS NORMOCYTIC, NORMOCHROMIC     METABOLIC PANEL, COMPREHENSIVE    Collection Time    01/13/12  6:42 PM       Component Value Range    Sodium 134 (*) 136 - 145 MMOL/L    Potassium 5.9 (*) 3.5 - 5.1 MMOL/L    Chloride 98  97 - 108 MMOL/L    CO2 25  21 - 32 MMOL/L    Anion gap 11  5 - 15 mmol/L    Glucose 86  65 - 100 MG/DL    BUN 15  6 - 20 MG/DL    Creatinine 8.11  9.14 -  1.15 MG/DL    BUN/Creatinine ratio 15  12 - 20      GFR est AA >60  >60 ml/min/1.30m2    GFR est non-AA >60  >60 ml/min/1.58m2    Calcium 10.5 (*) 8.5 - 10.1 MG/DL    Bilirubin, total 0.5  0.2 - 1.0 MG/DL    ALT 782 (*) 12 - 78 U/L    AST 154 (*) 15 - 37 U/L    Alk. phosphatase 180 (*) 50 - 136 U/L    Protein, total 11.0 (*) 6.4 - 8.2 g/dL    Albumin 4.9  3.5 - 5.0 g/dL    Globulin 6.1 (*) 2.0 - 4.0 g/dL    A-G Ratio 0.8 (*) 1.1 - 2.2     LIPASE    Collection Time    01/13/12  6:42 PM       Component Value Range    Lipase 185  73 - 393 U/L   HCG URINE, QL. - POC    Collection Time    01/13/12  6:49 PM       Component Value Range    Pregnancy test,urine (POC) NEGATIVE   NEGATIVE   METABOLIC PANEL, COMPREHENSIVE    Collection Time    01/13/12  8:00 PM       Component Value Range    Sodium 136  136 - 145 MMOL/L    Potassium 5.2 (*) 3.5 - 5.1 MMOL/L    Chloride 102  97 - 108  MMOL/L    CO2 24  21 - 32 MMOL/L    Anion gap 10  5 - 15 mmol/L    Glucose 84  65 - 100 MG/DL    BUN 14  6 - 20 MG/DL    Creatinine 6.57  8.46 - 1.15 MG/DL    BUN/Creatinine ratio 15  12 - 20      GFR est AA >60  >60 ml/min/1.60m2    GFR est non-AA >60  >60 ml/min/1.56m2    Calcium 9.2  8.5 - 10.1 MG/DL    Bilirubin, total 0.4  0.2 - 1.0 MG/DL    ALT 962 (*) 12 - 78 U/L    AST 119 (*) 15 - 37 U/L    Alk. phosphatase 148 (*) 50 - 136 U/L    Protein, total 8.9 (*) 6.4 - 8.2 g/dL    Albumin 3.9  3.5 - 5.0 g/dL    Globulin 5.0 (*) 2.0 - 4.0 g/dL    A-G Ratio 0.8 (*) 1.1 - 2.2       ________________________________________________________________________  Medications reviewed  Current Facility-Administered Medications   Medication Dose Route Frequency   ??? sodium chloride 0.9 % bolus infusion 1,000 mL  1,000 mL IntraVENous CONTINUOUS   ??? ondansetron (ZOFRAN) injection 4 mg  4 mg IntraVENous NOW   ??? ketorolac (TORADOL) injection 30 mg  30 mg IntraVENous NOW   ??? sodium chloride (NS) 0.9 % flush       ??? sodium chloride 0.9 % bolus infusion 1,000 mL  1,000 mL  IntraVENous ONCE     No current outpatient prescriptions on file.       Ala Bent, DO  01/14/2012  12:08 AM

## 2012-01-14 NOTE — Progress Notes (Signed)
Notified by PT that pt is not in her room and IV fluids are going all over the floor.  Entered room 218 to find IV pulled out an lying on floor, fluids still running.  Pt not signed out for any off floor tests.  Searched hospital grounds and notified security of pt eloping.  Charge nurse and MD also notified.

## 2012-01-14 NOTE — Progress Notes (Signed)
Physical Therapy  Attempted to see patient this am, but off floor.  Will f/u at a later time.  Kathi Der PT

## 2012-01-15 LAB — HEPATITIS PANEL, ACUTE
Hep B Core Ab, IgM: NEGATIVE
Hep B surface Ag screen: NEGATIVE
Hep C Virus Ab: >11 s/co ratio — ABNORMAL HIGH (ref 0.0–0.9)
Hepatitis A Ab, IgM: NEGATIVE

## 2012-01-15 LAB — CULTURE, URINE
Colonies Counted: >100000
Colony Count: >100000

## 2012-01-15 NOTE — Progress Notes (Signed)
Follow up call.    Pt left AMA. Pt says that she will call PCP for appt today. She asked what her admitting diagnosis was and I informed her she was being worked up for acute hepatitis, but left the hospital before complete workup was completed. Pt also educated about symptoms that would prompt a need to return to hospital. No further issues.    Gracelyn Nurse, MSW, [phone]

## 2012-08-20 MED FILL — FUL-GLO 1 MG EYE STRIPS: 1 mg | OPHTHALMIC | Qty: 1

## 2012-08-20 MED FILL — TETRACAINE HCL (PF) 0.5 % EYE DROPS: 0.5 % | OPHTHALMIC | Qty: 2

## 2012-08-20 NOTE — ED Provider Notes (Addendum)
HPI Comments: 26 y.o female presents ambulatory to [facility] ED With cc of R eye pain (7/10), redness, and blurred vision. Pt reports yesterday she noticed her eye was red but states she woke up this morning with pain and swelling. Pt denies any itching or discharge from eye. Pt also denies any injury to eye. 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 Pt is also complaining of HA but has no other complaints. Pt denies any F/C, diaphoresis, or rash.    PCP: Edison Simon, MD    Social Hx: +tobacco, -EtOH, former heroin    There are no other complaints, changes or physical findings at this time.   Written by Jackelyn Hoehn, ED Scribe, as dictated by Jumaane Weatherford N. Steffi Noviello, MD.        The history is provided by the patient.        Past Medical History   Diagnosis Date   ??? Sickle-cell disease, unspecified trait   ??? Kidney calculus         Past Surgical History   Procedure Laterality Date   ??? Hx heent       wisdom teeth extraction         Family History   Problem Relation Age of Onset   ??? Diabetes Mother    ??? Hypertension Mother    ??? Asthma Mother    ??? Migraines Mother    ??? Hypertension Father    ??? Alcohol abuse Father    ??? Asthma Brother         History     Social History   ??? Marital Status: SINGLE     Spouse Name: N/A     Number of Children: N/A   ??? Years of Education: N/A     Occupational History   ??? Not on file.     Social History Main Topics   ??? Smoking status: Current Every Day Smoker -- 1.00 packs/day for 11 years   ??? Smokeless tobacco: Never Used   ??? Alcohol Use: No   ??? 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 Sexually Active: Yes -- Female partner(s)     Birth Control/ Protection: None     Other Topics Concern   ??? Not on file     Social History Narrative   ??? No narrative on file                  ALLERGIES: Hydrocodone and Iodinated contrast media - iv dye      Review of Systems    Constitutional: Negative.  Negative for fever, chills and diaphoresis.   Eyes: Positive for pain, redness and visual disturbance. Negative for discharge and itching.   Respiratory: Negative.    Cardiovascular: Negative.    Gastrointestinal: Negative.    Endocrine: Negative.    Genitourinary: Negative.    Musculoskeletal: Negative.    Skin: Negative.  Negative for rash.   Allergic/Immunologic: Negative.    Neurological: Positive for headaches.   All other systems reviewed and are negative.        Filed Vitals:    08/20/12 0259   BP: 138/96   Pulse: 120   Temp: 98 ??F (36.7 ??C)   Resp: 18   Height: 5\' 6"  (1.676 m)   Weight:  62 kg (136 lb 11 oz)   SpO2: 98%            Physical Exam   General appearance - well nourished, well appearing, and in no distress  Eyes - pupils equal and reactive, extraocular eye movements intact, right eye injected with subconjunctival hemorrhage on medial aspect, no foreign body, conjunctiva and sclera appear clear  ENT - mucous membranes moist, pharynx normal without lesions  Neck - supple, no significant adenopathy; non-tender to palpation  Chest - clear to auscultation, no wheezes, rales or rhonchi; non-tender to palpation  Heart - normal rate and regular rhythm, S1 and S2 normal, no murmurs noted  Abdomen - soft, nontender, nondistended, no masses or organomegaly  Musculoskeletal - no joint tenderness, deformity or swelling; normal ROM  Extremities - peripheral pulses normal, no pedal edema  Skin - normal coloration and turgor, no rashes  Neurological - alert, oriented x3, normal speech, no focal findings or movement disorder noted  Written by Jackelyn Hoehn, ED Scribe, as dictated by Adali Pennings N. Curtis Uriarte, MD.      MDM     Differential Diagnosis; Clinical Impression; Plan:     Corneal abrasion, subconjunctival hemorrhage, foreign body      Procedures    Procedure Note - Wood's lamp exam:  4:13 AM  Performed by: Izabel Chim N. Jocee Kissick, MD  Pt???s R eye was anesthetized with tetracaine, stained with  fluorescein, and examined with a Wood's lamp, using lid eversion.    Foreign body: no  Fluorescein uptake: no, showing no corneal abrasion  The procedure took 1-15 minutes, and pt tolerated well.  Written by Jackelyn Hoehn, ED Scribe, as dictated by Rutger Salton N. Dareth Andrew, MD.    Procedure Note - Tono-pen Exam:  4:14 AM   Performed by Shemika Robbs N. Karan Ramnauth, MD:  Pt's intraocular pressure in her right eye was checked using a tono-pen.  Prior to procedure tono-pen was calibrated and reset for accurate measurement.  Pressure was 14 mmHg in her right eye.    The procedure took 1-15 minutes, and pt tolerated well.  Written by Jackelyn Hoehn, ED Scribe, as dictated by Alif Petrak N. Jorgia Manthei, MD.    4:14 AM  Pt has provided additional hx reporting that she was in hospital 3 weeks ago for a kidney stone and notes at that time also had elevated LFT's.   Written by Jackelyn Hoehn, ED Scribe, as dictated by Cristy Colmenares N. Spencer Cardinal, MD.    LABORATORY TESTS:  No results found for this or any previous visit (from the past 12 hour(s)).    MEDICATIONS GIVEN:  Medications   fluorescein (FUL-GLO) 1 mg ophthalmic strip 1 Strip (not administered)   tetracaine HCl (PF) (PONTOCAINE) 0.5 % ophthalmic solution 1 Drop (not administered)       IMPRESSION:  1. Subconjunctival hemorrhage, non-traumatic    2. Pain, eye, right        PLAN:   1. Ultram  2. Polytrim  3. F/U with Dr. Ave Filter (ophthalmology)  Return to ED if worse     4:16 AM  Izabellah T Pita's  results have been reviewed with her.  She has been counseled regarding her diagnosis.  She verbally conveys understanding and agreement of the signs, symptoms, diagnosis, treatment and prognosis and additionally agrees to follow up as recommended with Dr. Ave Filter (ophthalmology).  She also agrees with the care-plan and conveys that all of her questions have been answered.   Written by Jackelyn Hoehn, ED Scribe, as  dictated by Fara Worthy N. Mauricia Mertens, MD.

## 2012-08-20 NOTE — ED Notes (Signed)
Pt comes to ED from home with c/o right eye pain, blurred vision, and pain. Pt's eye is noted to be red. Denies any drainage. Pt states she has been using OTC eye wash.

## 2012-08-20 NOTE — ED Notes (Signed)
Dr O'Bier at bedside.

## 2012-08-20 NOTE — ED Notes (Signed)
Visual acuity completed:  Left 20/16, right 20/25, both 20/16. Dr Wendall Papa given supplies for eye exam. Eye cart outside of room.

## 2012-08-20 NOTE — ED Notes (Signed)
Pt has been discharged by MD. Pt left before RN was able to re-evaluate pt.

## 2012-12-04 NOTE — ED Notes (Signed)
Attempted to room, no answer.

## 2012-12-04 NOTE — ED Notes (Signed)
attempted to room, no answer.

## 2012-12-04 NOTE — ED Notes (Signed)
Pt called to treatment area. No answer.

## 2012-12-13 NOTE — ED Provider Notes (Signed)
Patient is a 27 y.o. female presenting with toe pain.   Toe Pain          Past Medical History   Diagnosis Date   ??? Sickle-cell disease, unspecified trait   ??? Kidney calculus         Past Surgical History   Procedure Laterality Date   ??? Hx heent       wisdom teeth extraction         Family History   Problem Relation Age of Onset   ??? Diabetes Mother    ??? Hypertension Mother    ??? Asthma Mother    ??? Migraines Mother    ??? Hypertension Father    ??? Alcohol abuse Father    ??? Asthma Brother         History     Social History   ??? Marital Status: SINGLE     Spouse Name: N/A     Number of Children: N/A   ??? Years of Education: N/A     Occupational History   ??? Not on file.     Social History Main Topics   ??? Smoking status: Current Every Day Smoker -- 1.00 packs/day for 11 years   ??? Smokeless tobacco: Never Used   ??? Alcohol Use: No   ??? 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 Sexually Active: Yes -- Female partner(s)     Birth Control/ Protection: None     Other Topics Concern   ??? Not on file     Social History Narrative   ??? No narrative on file                  ALLERGIES: Hydrocodone and Iodinated contrast media - iv dye      Review of Systems    Filed Vitals:    12/04/12 2024   BP: 131/82   Pulse: 90   Temp: 98.4 ??F (36.9 ??C)   Resp: 18   Height: 5\' 7"  (1.702 m)   Weight: 63.5 kg (139 lb 15.9 oz)   SpO2: 100%            Physical Exam     MDM    Procedures      PT LEFT WITHOUT BEING SEEN.

## 2016-06-26 ENCOUNTER — Inpatient Hospital Stay
Admit: 2016-06-26 | Discharge: 2016-06-27 | Disposition: A | Discharge status: Home / Self Care | Payer: Self-pay | Attending: Emergency Medicine

## 2016-06-26 ENCOUNTER — Emergency Department: Admit: 2016-06-26 | Payer: Self-pay | Primary: Family Medicine

## 2016-06-26 DIAGNOSIS — L84 Corns and callosities: Secondary | ICD-10-CM

## 2016-06-26 LAB — HCG URINE, QL. - POC: Pregnancy test,urine (POC): NEGATIVE

## 2016-06-26 LAB — WET PREP
Clue cells: ABSENT
Wet prep: NONE SEEN

## 2016-06-26 NOTE — ED Provider Notes (Signed)
HPI Comments: This is a 30 yo AAF with medical hx remarkable for sickle cell trait presenting ambulatory to the ED with complaint of bilateral foot pain, located on the soles for the past several months. She reports being homeless and having to walk frequently while wearing clogs. Pain worse with standing and walking. Associated malodor.  No drainage, redness or warmth from the area.      Also reports concern for bacteria vaginosis. Vaginal discharge described as malodorous, worse after intercourse. Thin white in appearing.    Also reports a cough x one year. Daily smoker since age 30.  Increased mucous production. No headache, ear pain, sore throat, nasal congestion, chest pain, SOB, abdominal pain, vaginal bleeding or vaginal pain. Has essure in place.     Patient is a [age] y.o. female presenting with foot pain and vaginal discharge. The history is provided by the patient.   Foot Pain    Pertinent negatives include no numbness, no back pain and no neck pain.   Vaginal Discharge    Pertinent negatives include no fever, no abdominal pain, no constipation, no diarrhea, no nausea, no vomiting and no frequency.        Past Medical History:   Diagnosis Date   ??? Kidney calculus    ??? Sickle-cell disease, unspecified trait       Past Surgical History:   Procedure Laterality Date   ??? HX HEENT      wisdom teeth extraction         Family History:   Problem Relation Age of Onset   ??? Diabetes Mother    ??? Hypertension Mother    ??? Asthma Mother    ??? Migraines Mother    ??? Hypertension Father    ??? Alcohol abuse Father    ??? Asthma Brother        Social History     Social History   ??? Marital status: SINGLE     Spouse name: N/A   ??? Number of children: N/A   ??? Years of education: N/A     Occupational History   ??? Not on file.     Social History Main Topics   ??? Smoking status: Current Every Day Smoker     Packs/day: 1.00     Years: 11.00   ??? Smokeless tobacco: Never Used   ??? Alcohol use No   ??? 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 Sexual activity: Yes     Partners: Male     Birth control/ protection: None     Other Topics Concern   ??? Not on file     Social History Narrative         ALLERGIES: Hydrocodone and Iodinated contrast- oral and iv dye    Review of Systems   Constitutional: Negative.  Negative for chills, fatigue and fever.   HENT: Negative.  Negative for congestion, ear pain, facial swelling, rhinorrhea, sneezing and sore throat.    Eyes: Negative for pain, discharge and itching.   Respiratory: Positive for cough. Negative for chest tightness and shortness of breath.    Cardiovascular: Negative.  Negative for chest pain and leg swelling.   Gastrointestinal: Negative.  Negative for abdominal distention, abdominal pain, constipation, diarrhea, nausea and vomiting.   Genitourinary: Positive for vaginal discharge. Negative for difficulty urinating, frequency and urgency.   Musculoskeletal: Positive for arthralgias (bilateral plantar  surface of feet). Negative for back pain, joint swelling, neck pain and neck stiffness.   Skin: Negative for color change and rash.   Neurological: Negative for dizziness, numbness and headaches.   Psychiatric/Behavioral: Negative for confusion and decreased concentration.   All other systems reviewed and are negative.      Vitals:    06/26/16 1815   BP: 144/88   Pulse: (!) 115   Resp: 18   Temp: 99.2 ??F (37.3 ??C)   SpO2: 99%   Weight: 61.9 kg (136 lb 6.4 oz)   Height: 5\' 6"  (1.676 m)            Physical Exam   Constitutional: She is oriented to person, place, and time. She appears well-developed and well-nourished. No distress.   AAF seated in NAD   HENT:   Head: Normocephalic and atraumatic.   Right Ear: External ear normal.   Left Ear: External ear normal.   Nose: Nose normal.   Mouth/Throat: Oropharynx is clear and moist. No oropharyngeal exudate.   Eyes: Conjunctivae and EOM are normal. Pupils are equal, round, and  reactive to light. Right eye exhibits no discharge. Left eye exhibits no discharge. No scleral icterus.   Neck: Normal range of motion. Neck supple.   Cardiovascular: Normal rate and regular rhythm.  Exam reveals no gallop and no friction rub.    No murmur heard.  Pulmonary/Chest: Effort normal. She has wheezes (trace). She has no rales.   Abdominal: Soft. Bowel sounds are normal. She exhibits no distension. There is no tenderness. There is no rebound and no guarding.   Genitourinary: Pelvic exam was performed with patient supine. There is no rash, tenderness or lesion on the right labia. There is no rash, tenderness or lesion on the left labia. Cervix exhibits discharge. Cervix exhibits no motion tenderness. Right adnexum displays no mass and no tenderness. Left adnexum displays no mass and no tenderness. No bleeding in the vagina. No foreign body in the vagina. Vaginal discharge found.   Musculoskeletal:        Feet:    Neurological: She is alert and oriented to person, place, and time. No cranial nerve deficit. Coordination normal.   Skin: Skin is warm and dry. She is not diaphoretic.   Psychiatric: She has a normal mood and affect. Her behavior is normal.   Nursing note and vitals reviewed.       MDM  Number of Diagnoses or Management Options  Diagnosis management comments: 30 yo AAF with several complaints.   Vaginal discharge.  Will check pelvic swabs  Cough x one year ? COPD vs PNA vs malignancy  Foot pain: Callous noted on exam    ED Course       Procedures  Progress note    Labs and imaging reviewed. _ clue cells, yeast or trich.  G/C pending. Pt denies concern. Keiasha Diep C [place], [place]  Chest xray clear. Suspect cough secondary to smoking. Aramis Weil C [place], [place]    Patient's results have been reviewed with them.  Patient and/or family have verbally conveyed their understanding and agreement of the patient's signs, symptoms, diagnosis, treatment and prognosis and additionally agree  to follow up as recommended or return to the Emergency Room should their condition change prior to follow-up.  Discharge instructions have also been provided to the patient with some educational information regarding their diagnosis as well a list of reasons why they would want to return to the ER prior to their follow-up appointment should  their condition change. Morghan Kester [place], [place]    A/P  Callous/vaginal discharge/cough: Please follow-up with foot doctor. Soak the feet. Apply wet dressings to the area.  Follow-up with regular doctor about vaginal discharge and cough. Wing Schoch C [place], [place]  Return for any new or worsening. Alayna Mabe C [place], [place]

## 2016-06-26 NOTE — ED Triage Notes (Signed)
Pt. complains of pain to the bottom of her right foot.  Pain has been present "for a long time".  Denies fall or inj.  Also complains of having bacterial vaginosis.

## 2016-06-26 NOTE — ED Notes (Signed)
Patient received discharge instructions by MD and nurse. Patient verbalized understanding of medication use and other discharge instructions.     Updated vitals, IV DC and patient ambulated out of ED with steady gait, showing no signs of distress. Pt reports relief of most intense pain. All questions answered.

## 2016-06-27 LAB — CHLAMYDIA/GC PCR
Chlamydia amplified: NEGATIVE
N. gonorrhea, amplified: NEGATIVE

## 2017-12-30 ENCOUNTER — Other Ambulatory Visit: Payer: Self-pay

## 2017-12-30 ENCOUNTER — Emergency Department (HOSPITAL_BASED_OUTPATIENT_CLINIC_OR_DEPARTMENT_OTHER): Payer: Self-pay

## 2017-12-30 ENCOUNTER — Emergency Department (HOSPITAL_BASED_OUTPATIENT_CLINIC_OR_DEPARTMENT_OTHER)
Admission: EM | Admit: 2017-12-30 | Discharge: 2017-12-31 | Disposition: A | Discharge status: Home / Self Care | Payer: Self-pay | Attending: Emergency Medicine | Admitting: Emergency Medicine

## 2017-12-30 ENCOUNTER — Encounter (HOSPITAL_BASED_OUTPATIENT_CLINIC_OR_DEPARTMENT_OTHER): Payer: Self-pay | Admitting: *Deleted

## 2017-12-30 DIAGNOSIS — R1084 Generalized abdominal pain: Secondary | ICD-10-CM | POA: Insufficient documentation

## 2017-12-30 DIAGNOSIS — R51 Headache: Secondary | ICD-10-CM | POA: Insufficient documentation

## 2017-12-30 DIAGNOSIS — R112 Nausea with vomiting, unspecified: Secondary | ICD-10-CM | POA: Insufficient documentation

## 2017-12-30 DIAGNOSIS — F17210 Nicotine dependence, cigarettes, uncomplicated: Secondary | ICD-10-CM | POA: Insufficient documentation

## 2017-12-30 LAB — CBC
HCT: 34.9 % — ABNORMAL LOW (ref 36.0–46.0)
Hemoglobin: 12.7 g/dL (ref 12.0–15.0)
MCH: 31.1 pg (ref 26.0–34.0)
MCHC: 36.4 g/dL — ABNORMAL HIGH (ref 30.0–36.0)
MCV: 85.3 fL (ref 78.0–100.0)
Platelets: 234 10*3/uL (ref 150–400)
RBC: 4.09 MIL/uL (ref 3.87–5.11)
RDW: 15.1 % (ref 11.5–15.5)
WBC: 10 10*3/uL (ref 4.0–10.5)

## 2017-12-30 LAB — URINALYSIS, ROUTINE W REFLEX MICROSCOPIC
Bilirubin Urine: NEGATIVE
Glucose, UA: NEGATIVE mg/dL
Hgb urine dipstick: NEGATIVE
Ketones, ur: NEGATIVE mg/dL
Leukocytes, UA: NEGATIVE
Nitrite: NEGATIVE
Protein, ur: NEGATIVE mg/dL
Specific Gravity, Urine: >1.03 — ABNORMAL HIGH (ref 1.005–1.030)
pH: 6 (ref 5.0–8.0)

## 2017-12-30 LAB — COMPREHENSIVE METABOLIC PANEL
ALT: 61 U/L — ABNORMAL HIGH (ref 14–54)
AST: 41 U/L (ref 15–41)
Albumin: 4.2 g/dL (ref 3.5–5.0)
Alkaline Phosphatase: 59 U/L (ref 38–126)
Anion gap: 9 (ref 5–15)
BUN: 18 mg/dL (ref 6–20)
CO2: 20 mmol/L — ABNORMAL LOW (ref 22–32)
Calcium: 8.9 mg/dL (ref 8.9–10.3)
Chloride: 106 mmol/L (ref 101–111)
Creatinine, Ser: 1.1 mg/dL — ABNORMAL HIGH (ref 0.44–1.00)
GFR calc Af Amer: >60 mL/min (ref >60)
GFR calc non Af Amer: >60 mL/min (ref >60)
Glucose, Bld: 83 mg/dL (ref 65–99)
Potassium: 4.5 mmol/L (ref 3.5–5.1)
Sodium: 135 mmol/L (ref 135–145)
Total Bilirubin: 0.5 mg/dL (ref 0.3–1.2)
Total Protein: 7.9 g/dL (ref 6.5–8.1)

## 2017-12-30 LAB — CBG MONITORING, ED: Glucose-Capillary: 80 mg/dL (ref 65–99)

## 2017-12-30 LAB — PREGNANCY, URINE: Preg Test, Ur: NEGATIVE

## 2017-12-30 LAB — LIPASE, BLOOD: Lipase: 31 U/L (ref 11–51)

## 2017-12-30 MED ORDER — MORPHINE SULFATE (PF) 4 MG/ML IV SOLN
4.0000 mg | Freq: Once | INTRAVENOUS | Status: AC
  Administered 2017-12-30: 4 mg via INTRAVENOUS
  Filled 2017-12-30: qty 1

## 2017-12-30 MED ORDER — ONDANSETRON HCL 4 MG/2ML IJ SOLN
4.0000 mg | Freq: Once | INTRAMUSCULAR | Status: AC
  Administered 2017-12-30: 4 mg via INTRAVENOUS
  Filled 2017-12-30: qty 2

## 2017-12-30 MED ORDER — SODIUM CHLORIDE 0.9 % IV BOLUS
1000.0000 mL | Freq: Once | INTRAVENOUS | Status: AC
  Administered 2017-12-30: 1000 mL via INTRAVENOUS

## 2017-12-30 NOTE — ED Provider Notes (Signed)
[facility] EMERGENCY DEPARTMENT Provider Note   CSN: [id number] Arrival date & time: 12/30/17  2059     History   Chief Complaint Chief Complaint  Patient presents with  . Emesis  . Headache    HPI Molly Thornton is a 32 y.o. female.  Patient presents to the ER for evaluation of abdominal pain with nausea and vomiting.  She reports that she has not been feeling well for several months.  She says she has lost approximately 30 pounds over that time.  She reports that previously she has had minor pain with nausea, queasiness and poor appetite.  Today, however, she had increased generalized abdominal pain and has had nausea and vomiting.  She reports concomitant headache.  Headache is mild and throbbing in nature, not significantly different from previous headaches she has had.  No focal neurologic symptoms.  She does report occasional loose stools but no frank diarrhea.  No rectal bleeding or hematemesis.  She has not had any fever.  Patient reports that she was diagnosed with hepatitis C approximately a year ago but has not been followed by gastroenterology because she does not have insurance.     History reviewed. No pertinent past medical history.  There are no active problems to display for this patient.   History reviewed. No pertinent surgical history.   OB History   None      Home Medications    Prior to Admission medications   Medication Sig Start Date End Date Taking? Authorizing Provider  promethazine (PHENERGAN) 25 MG tablet Take 1 tablet (25 mg total) by mouth every 6 (six) hours as needed for nausea or vomiting. 12/31/17   Gilda Crease, MD  traMADol (ULTRAM) 50 MG tablet Take 1 tablet (50 mg total) by mouth every 6 (six) hours as needed. 12/31/17   Gilda Crease, MD    Family History No family history on file.  Social History Social History   Tobacco Use  . Smoking status: Current Every Day Smoker  . Smokeless tobacco:  Current User  Substance Use Topics  . Alcohol use: Never    Frequency: Never  . Drug use: Never     Allergies   Ivp dye [iodinated diagnostic agents]   Review of Systems Review of Systems  Constitutional: Positive for appetite change and unexpected weight change.  Gastrointestinal: Positive for abdominal pain, nausea and vomiting.  All other systems reviewed and are negative.    Physical Exam Updated Vital Signs BP (!) 100/56 (BP Location: Right Arm)   Pulse 73   Temp 98.6 F (37 C) (Oral)   Resp 16   Ht 5\' 7"  (1.702 m)   Wt 60.5 kg (133 lb 4.8 oz)   LMP 12/07/2017 Comment: (-) urine pregnancy test//a.c.  SpO2 100%   BMI 20.88 kg/m   Physical Exam  Constitutional: She is oriented to person, place, and time. She appears well-developed and well-nourished. No distress.  HENT:  Head: Normocephalic and atraumatic.  Right Ear: Hearing normal.  Left Ear: Hearing normal.  Nose: Nose normal.  Mouth/Throat: Oropharynx is clear and moist and mucous membranes are normal.  Eyes: Pupils are equal, round, and reactive to light. Conjunctivae and EOM are normal.  Neck: Normal range of motion. Neck supple.  Cardiovascular: Regular rhythm, S1 normal and S2 normal. Exam reveals no gallop and no friction rub.  No murmur heard. Pulmonary/Chest: Effort normal and breath sounds normal. No respiratory distress. She exhibits no tenderness.  Abdominal: Soft. Normal appearance  and bowel sounds are normal. There is no hepatosplenomegaly. There is generalized tenderness. There is no rebound, no guarding, no tenderness at McBurney's point and negative Murphy's sign. No hernia.  Musculoskeletal: Normal range of motion.  Neurological: She is alert and oriented to person, place, and time. She has normal strength. No cranial nerve deficit or sensory deficit. Coordination normal. GCS eye subscore is 4. GCS verbal subscore is 5. GCS motor subscore is 6.  Skin: Skin is warm, dry and intact. No rash  noted. No cyanosis.  Psychiatric: She has a normal mood and affect. Her speech is normal and behavior is normal. Thought content normal.  Nursing note and vitals reviewed.    ED Treatments / Results  Labs (all labs ordered are listed, but only abnormal results are displayed) Labs Reviewed  URINALYSIS, ROUTINE W REFLEX MICROSCOPIC - Abnormal; Notable for the following components:      Result Value   Specific Gravity, Urine >1.030 (*)    All other components within normal limits  COMPREHENSIVE METABOLIC PANEL - Abnormal; Notable for the following components:   CO2 20 (*)    Creatinine, Ser 1.10 (*)    ALT 61 (*)    All other components within normal limits  CBC - Abnormal; Notable for the following components:   HCT 34.9 (*)    MCHC 36.4 (*)    All other components within normal limits  LIPASE, BLOOD  PREGNANCY, URINE  CBG MONITORING, ED    EKG None  Radiology Ct Abdomen Pelvis Wo Contrast  Result Date: 12/31/2017 CLINICAL DATA:  Acute abdominal pain.  Weight loss. EXAM: CT ABDOMEN AND PELVIS WITHOUT CONTRAST TECHNIQUE: Multidetector CT imaging of the abdomen and pelvis was performed following the standard protocol without IV contrast. COMPARISON:  None. FINDINGS: Lower chest: The lung bases are clear. Hepatobiliary: No evidence of focal hepatic lesion allowing for lack contrast. Gallbladder physiologically distended, no calcified stone. No biliary dilatation. Pancreas: No ductal dilatation or inflammation. Spleen: Normal in size without focal abnormality. Adrenals/Urinary Tract: No adrenal nodule. Nonobstructing 3 mm stone in the lower right kidney. No hydronephrosis. No perinephric edema. Urinary bladder is partially distended without wall thickening. Stomach/Bowel: Stomach is physiologically distended. Administered enteric contrast reaches the mid small bowel. No bowel wall thickening, inflammatory change or obstruction. Normal appendix. Vascular/Lymphatic: Normal course and caliber  of abdominal vessels. Lack of IV contrast and paucity of intra-abdominal fat limits assessment for adenopathy, no bulky adenopathy is seen. Reproductive: Bilateral tubal occlusion devices, left in the region of the proximal fallopian tube in cornua. The right tubal occlusion wire is posteriorly in the pelvis, image 61 series 2, not definitively within the fallopian tube. No evidence of adnexal mass. Other: Trace free fluid in the pelvis measures simple fluid density. No free air or evidence of intra-abdominal abscess. Musculoskeletal: There are no acute or suspicious osseous abnormalities. IMPRESSION: 1. Nonobstructing right renal stone. No hydronephrosis or obstructive uropathy. 2. Right tubal occlusion wire posteriorly position in the pelvis, not definitively in the fallopian tube. Recommend GYN consultation for further evaluation of possible misplaced tubal occlusion. Tubal occlusion on the left appears appropriately position. Electronically Signed   By: Rubye Oaks M.D.   On: 12/31/2017 01:13    Procedures Procedures (including critical care time)  Medications Ordered in ED Medications  morphine 4 MG/ML injection 4 mg (4 mg Intravenous Given 12/30/17 2340)  ondansetron (ZOFRAN) injection 4 mg (4 mg Intravenous Given 12/30/17 2339)  sodium chloride 0.9 % bolus 1,000 mL (1,000  mLs Intravenous New Bag/Given 12/30/17 2340)     Initial Impression / Assessment and Plan / ED Course  I have reviewed the triage vital signs and the nursing notes.  Pertinent labs & imaging results that were available during my care of the patient were reviewed by me and considered in my medical decision making (see chart for details).     Patient presents to the emergency department for evaluation of nausea and vomiting with abdominal pain.  She reports that she has not been feeling well for several months and has had significant weight loss.  She has been previously diagnosed with hepatitis C, not currently treated or  followed by anyone because of lack of insurance.  She has developed abdominal pain today and has vomited throughout the course of the day.  She was treated with IV fluids.  Blood work is essentially normal including normal LFTs.  She does not have any signs of peritonitis on examination.  She did have a CT scan to evaluate for new onset pain and vomiting.  No acute abnormality is noted.  There is concern raised for possible misplaced tubal occlusion clip on the right.  She will need to follow-up with OB/GYN as an outpatient for this.  This is not causing her current symptoms, however.  She will be referred to GI for further evaluation.  Patient has been stabilized, appropriate for outpatient management and symptomatic treatment.  Final Clinical Impressions(s) / ED Diagnoses   Final diagnoses:  Non-intractable vomiting with nausea, unspecified vomiting type  Generalized abdominal pain    ED Discharge Orders        Ordered    promethazine (PHENERGAN) 25 MG tablet  Every 6 hours PRN     12/31/17 0142    traMADol (ULTRAM) 50 MG tablet  Every 6 hours PRN     12/31/17 0142       Gilda CreasePollina, Aydenn Gervin J, MD 12/31/17 [phone]

## 2017-12-30 NOTE — ED Triage Notes (Signed)
Vomiting all day. For the past 2 months she has lost 30 pounds and wakes with nausea.

## 2017-12-31 MED ORDER — TRAMADOL HCL 50 MG PO TABS: 50.0000 mg | ORAL_TABLET | Freq: Four times a day (QID) | ORAL | 0 refills | Status: DC | PRN

## 2017-12-31 MED ORDER — PROMETHAZINE HCL 25 MG PO TABS: 25.0000 mg | ORAL_TABLET | Freq: Four times a day (QID) | ORAL | 0 refills | Status: DC | PRN

## 2018-03-26 ENCOUNTER — Emergency Department (HOSPITAL_COMMUNITY)
Admission: EM | Admit: 2018-03-26 | Discharge: 2018-03-26 | Disposition: A | Discharge status: Home / Self Care | Payer: Self-pay | Attending: Emergency Medicine | Admitting: Emergency Medicine

## 2018-03-26 ENCOUNTER — Emergency Department (HOSPITAL_COMMUNITY): Payer: Self-pay

## 2018-03-26 ENCOUNTER — Encounter (HOSPITAL_COMMUNITY): Payer: Self-pay

## 2018-03-26 ENCOUNTER — Other Ambulatory Visit: Payer: Self-pay

## 2018-03-26 DIAGNOSIS — M791 Myalgia, unspecified site: Secondary | ICD-10-CM | POA: Insufficient documentation

## 2018-03-26 DIAGNOSIS — F17200 Nicotine dependence, unspecified, uncomplicated: Secondary | ICD-10-CM | POA: Insufficient documentation

## 2018-03-26 DIAGNOSIS — Y92410 Unspecified street and highway as the place of occurrence of the external cause: Secondary | ICD-10-CM | POA: Insufficient documentation

## 2018-03-26 DIAGNOSIS — R112 Nausea with vomiting, unspecified: Secondary | ICD-10-CM | POA: Insufficient documentation

## 2018-03-26 DIAGNOSIS — S0990XA Unspecified injury of head, initial encounter: Secondary | ICD-10-CM | POA: Insufficient documentation

## 2018-03-26 DIAGNOSIS — Y998 Other external cause status: Secondary | ICD-10-CM | POA: Insufficient documentation

## 2018-03-26 DIAGNOSIS — Y939 Activity, unspecified: Secondary | ICD-10-CM | POA: Insufficient documentation

## 2018-03-26 DIAGNOSIS — M7918 Myalgia, other site: Secondary | ICD-10-CM

## 2018-03-26 MED ORDER — TRAMADOL HCL 50 MG PO TABS: 50.0000 mg | ORAL_TABLET | Freq: Four times a day (QID) | ORAL | 0 refills | Status: DC | PRN

## 2018-03-26 MED ORDER — FENTANYL CITRATE (PF) 100 MCG/2ML IJ SOLN
100.0000 ug | Freq: Once | INTRAMUSCULAR | Status: AC
  Administered 2018-03-26: 100 ug via INTRAVENOUS
  Filled 2018-03-26: qty 2

## 2018-03-26 MED ORDER — ONDANSETRON 4 MG PO TBDP: 4.0000 mg | ORAL_TABLET | Freq: Three times a day (TID) | ORAL | 0 refills | Status: DC | PRN

## 2018-03-26 MED ORDER — METHOCARBAMOL 500 MG PO TABS: 1000.0000 mg | ORAL_TABLET | Freq: Four times a day (QID) | ORAL | 0 refills | Status: DC

## 2018-03-26 MED ORDER — SODIUM CHLORIDE 0.9 % IV BOLUS
500.0000 mL | Freq: Once | INTRAVENOUS | Status: AC
  Administered 2018-03-26: 500 mL via INTRAVENOUS

## 2018-03-26 MED ORDER — DIPHENHYDRAMINE HCL 50 MG/ML IJ SOLN
25.0000 mg | Freq: Once | INTRAMUSCULAR | Status: AC
  Administered 2018-03-26: 25 mg via INTRAVENOUS
  Filled 2018-03-26: qty 1

## 2018-03-26 MED ORDER — ONDANSETRON 4 MG PO TBDP
4.0000 mg | ORAL_TABLET | Freq: Once | ORAL | Status: AC
  Administered 2018-03-26: 4 mg via ORAL
  Filled 2018-03-26: qty 1

## 2018-03-26 MED ORDER — NAPROXEN 500 MG PO TABS: 500.0000 mg | ORAL_TABLET | Freq: Two times a day (BID) | ORAL | 0 refills | Status: DC

## 2018-03-26 MED ORDER — OXYCODONE-ACETAMINOPHEN 5-325 MG PO TABS
1.0000 | ORAL_TABLET | Freq: Once | ORAL | Status: AC
  Administered 2018-03-26: 1 via ORAL
  Filled 2018-03-26: qty 1

## 2018-03-26 MED ORDER — METOCLOPRAMIDE HCL 5 MG/ML IJ SOLN
10.0000 mg | Freq: Once | INTRAMUSCULAR | Status: AC
  Administered 2018-03-26: 10 mg via INTRAVENOUS
  Filled 2018-03-26: qty 2
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
## 2018-03-26 NOTE — ED Notes (Signed)
Pt made aware urine sample is needed.  Pt wants to try after pains meds start working.
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
## 2018-05-01 ENCOUNTER — Observation Stay (HOSPITAL_COMMUNITY)
Admission: EM | Admit: 2018-05-01 | Discharge: 2018-05-02 | Disposition: A | Discharge status: Home / Self Care | Payer: Self-pay | Attending: Internal Medicine | Admitting: Internal Medicine

## 2018-05-01 ENCOUNTER — Encounter (HOSPITAL_COMMUNITY): Payer: Self-pay | Admitting: *Deleted

## 2018-05-01 DIAGNOSIS — G43709 Chronic migraine without aura, not intractable, without status migrainosus: Secondary | ICD-10-CM

## 2018-05-01 DIAGNOSIS — Z885 Allergy status to narcotic agent status: Secondary | ICD-10-CM

## 2018-05-01 DIAGNOSIS — F17210 Nicotine dependence, cigarettes, uncomplicated: Secondary | ICD-10-CM | POA: Insufficient documentation

## 2018-05-01 DIAGNOSIS — Z87448 Personal history of other diseases of urinary system: Secondary | ICD-10-CM

## 2018-05-01 DIAGNOSIS — F1911 Other psychoactive substance abuse, in remission: Secondary | ICD-10-CM

## 2018-05-01 DIAGNOSIS — R51 Headache: Secondary | ICD-10-CM

## 2018-05-01 DIAGNOSIS — N179 Acute kidney failure, unspecified: Secondary | ICD-10-CM

## 2018-05-01 DIAGNOSIS — Z720 Tobacco use: Secondary | ICD-10-CM

## 2018-05-01 DIAGNOSIS — Z87440 Personal history of urinary (tract) infections: Secondary | ICD-10-CM

## 2018-05-01 DIAGNOSIS — G43909 Migraine, unspecified, not intractable, without status migrainosus: Principal | ICD-10-CM | POA: Insufficient documentation

## 2018-05-01 DIAGNOSIS — R519 Headache, unspecified: Secondary | ICD-10-CM

## 2018-05-01 DIAGNOSIS — B1920 Unspecified viral hepatitis C without hepatic coma: Secondary | ICD-10-CM

## 2018-05-01 DIAGNOSIS — K219 Gastro-esophageal reflux disease without esophagitis: Secondary | ICD-10-CM

## 2018-05-01 DIAGNOSIS — N12 Tubulo-interstitial nephritis, not specified as acute or chronic: Secondary | ICD-10-CM | POA: Insufficient documentation

## 2018-05-01 DIAGNOSIS — Z91041 Radiographic dye allergy status: Secondary | ICD-10-CM

## 2018-05-01 DIAGNOSIS — N390 Urinary tract infection, site not specified: Secondary | ICD-10-CM

## 2018-05-01 DIAGNOSIS — Z79899 Other long term (current) drug therapy: Secondary | ICD-10-CM | POA: Insufficient documentation

## 2018-05-01 DIAGNOSIS — R112 Nausea with vomiting, unspecified: Secondary | ICD-10-CM | POA: Diagnosis present

## 2018-05-01 DIAGNOSIS — E860 Dehydration: Secondary | ICD-10-CM | POA: Insufficient documentation

## 2018-05-01 DIAGNOSIS — Z23 Encounter for immunization: Secondary | ICD-10-CM | POA: Insufficient documentation

## 2018-05-01 HISTORY — DX: Unspecified viral hepatitis C without hepatic coma: B19.20

## 2018-05-01 HISTORY — DX: Major depressive disorder, single episode, unspecified: F32.9

## 2018-05-01 HISTORY — DX: Anxiety disorder, unspecified: F41.9

## 2018-05-01 HISTORY — DX: Migraine, unspecified, not intractable, without status migrainosus: G43.909

## 2018-05-01 HISTORY — DX: Sickle-cell trait: D57.3

## 2018-05-01 HISTORY — DX: Post-traumatic stress disorder, unspecified: F43.10

## 2018-05-01 HISTORY — DX: Depression, unspecified: F32.A

## 2018-05-01 HISTORY — DX: Bipolar disorder, unspecified: F31.9

## 2018-05-01 LAB — URINALYSIS, ROUTINE W REFLEX MICROSCOPIC
Bilirubin Urine: NEGATIVE
Glucose, UA: NEGATIVE mg/dL
Ketones, ur: NEGATIVE mg/dL
Nitrite: POSITIVE — AB
Protein, ur: NEGATIVE mg/dL
Specific Gravity, Urine: 1.006 (ref 1.005–1.030)
pH: 6 (ref 5.0–8.0)

## 2018-05-01 LAB — CBC WITH DIFFERENTIAL/PLATELET
Abs Immature Granulocytes: 0.1 10*3/uL (ref 0.0–0.1)
Basophils Absolute: 0.1 10*3/uL (ref 0.0–0.1)
Basophils Relative: 1 %
Eosinophils Absolute: 0.1 10*3/uL (ref 0.0–0.7)
Eosinophils Relative: 1 %
HCT: 35.7 % — ABNORMAL LOW (ref 36.0–46.0)
Hemoglobin: 12.1 g/dL (ref 12.0–15.0)
Immature Granulocytes: 1 %
Lymphocytes Relative: 17 %
Lymphs Abs: 1.5 10*3/uL (ref 0.7–4.0)
MCH: 29.4 pg (ref 26.0–34.0)
MCHC: 33.9 g/dL (ref 30.0–36.0)
MCV: 86.7 fL (ref 78.0–100.0)
Monocytes Absolute: 0.6 10*3/uL (ref 0.1–1.0)
Monocytes Relative: 7 %
Neutro Abs: 6.5 10*3/uL (ref 1.7–7.7)
Neutrophils Relative %: 73 %
Platelets: 238 10*3/uL (ref 150–400)
RBC: 4.12 MIL/uL (ref 3.87–5.11)
RDW: 15.7 % — ABNORMAL HIGH (ref 11.5–15.5)
WBC: 8.8 10*3/uL (ref 4.0–10.5)

## 2018-05-01 LAB — COMPREHENSIVE METABOLIC PANEL
ALT: 43 U/L (ref 0–44)
AST: 39 U/L (ref 15–41)
Albumin: 4 g/dL (ref 3.5–5.0)
Alkaline Phosphatase: 52 U/L (ref 38–126)
Anion gap: 8 (ref 5–15)
BUN: 10 mg/dL (ref 6–20)
CO2: 26 mmol/L (ref 22–32)
Calcium: 9.3 mg/dL (ref 8.9–10.3)
Chloride: 105 mmol/L (ref 98–111)
Creatinine, Ser: 1.2 mg/dL — ABNORMAL HIGH (ref 0.44–1.00)
GFR calc Af Amer: >60 mL/min (ref >60)
GFR calc non Af Amer: 59 mL/min — ABNORMAL LOW (ref >60)
Glucose, Bld: 106 mg/dL — ABNORMAL HIGH (ref 70–99)
Potassium: 3.6 mmol/L (ref 3.5–5.1)
Sodium: 139 mmol/L (ref 135–145)
Total Bilirubin: 0.8 mg/dL (ref 0.3–1.2)
Total Protein: 7.1 g/dL (ref 6.5–8.1)

## 2018-05-01 LAB — RAPID URINE DRUG SCREEN, HOSP PERFORMED
Amphetamines: NOT DETECTED
Barbiturates: NOT DETECTED
Benzodiazepines: POSITIVE — AB
Cocaine: POSITIVE — AB
Opiates: NOT DETECTED
Tetrahydrocannabinol: POSITIVE — AB

## 2018-05-01 LAB — LIPASE, BLOOD: Lipase: 41 U/L (ref 11–51)

## 2018-05-01 LAB — I-STAT BETA HCG BLOOD, ED (MC, WL, AP ONLY): I-stat hCG, quantitative: <5 m[IU]/mL (ref <5)

## 2018-05-01 MED ORDER — ONDANSETRON HCL 4 MG/2ML IJ SOLN: 4.0000 mg | Freq: Four times a day (QID) | INTRAMUSCULAR | Status: DC | PRN

## 2018-05-01 MED ORDER — SUMATRIPTAN 20 MG/ACT NA SOLN
20.0000 mg | Freq: Once | NASAL | Status: AC
  Administered 2018-05-01: 20 mg via NASAL
  Filled 2018-05-01 (×2): qty 1

## 2018-05-01 MED ORDER — PROCHLORPERAZINE EDISYLATE 10 MG/2ML IJ SOLN
10.0000 mg | Freq: Once | INTRAMUSCULAR | Status: AC
  Administered 2018-05-01: 10 mg via INTRAVENOUS
  Filled 2018-05-01: qty 2

## 2018-05-01 MED ORDER — DIPHENHYDRAMINE HCL 50 MG/ML IJ SOLN
25.0000 mg | Freq: Once | INTRAMUSCULAR | Status: AC
  Administered 2018-05-01: 25 mg via INTRAVENOUS
  Filled 2018-05-01: qty 1

## 2018-05-01 MED ORDER — SODIUM CHLORIDE 0.9 % IV SOLN
1.0000 g | INTRAVENOUS | Status: AC
  Administered 2018-05-02: 1 g via INTRAVENOUS
  Filled 2018-05-01: qty 10

## 2018-05-01 MED ORDER — ACETAMINOPHEN 650 MG RE SUPP: 650.0000 mg | Freq: Four times a day (QID) | RECTAL | Status: DC | PRN

## 2018-05-01 MED ORDER — GI COCKTAIL ~~LOC~~: 30.0000 mL | Freq: Once | ORAL | Status: DC

## 2018-05-01 MED ORDER — LORAZEPAM 1 MG PO TABS
1.0000 mg | ORAL_TABLET | Freq: Once | ORAL | Status: AC
  Administered 2018-05-01: 1 mg via SUBLINGUAL
  Filled 2018-05-01: qty 1

## 2018-05-01 MED ORDER — ONDANSETRON HCL 4 MG/2ML IJ SOLN
4.0000 mg | Freq: Once | INTRAMUSCULAR | Status: AC
  Administered 2018-05-01: 4 mg via INTRAVENOUS
  Filled 2018-05-01: qty 2

## 2018-05-01 MED ORDER — SODIUM CHLORIDE 0.9% FLUSH
3.0000 mL | Freq: Two times a day (BID) | INTRAVENOUS | Status: DC
  Administered 2018-05-01 – 2018-05-02 (×3): 3 mL via INTRAVENOUS

## 2018-05-01 MED ORDER — LACTATED RINGERS IV BOLUS
1000.0000 mL | Freq: Once | INTRAVENOUS | Status: AC
  Administered 2018-05-01: 1000 mL via INTRAVENOUS

## 2018-05-01 MED ORDER — ACETAMINOPHEN 325 MG PO TABS
650.0000 mg | ORAL_TABLET | Freq: Four times a day (QID) | ORAL | Status: DC | PRN
  Administered 2018-05-02 (×3): 650 mg via ORAL
  Filled 2018-05-01 (×3): qty 2

## 2018-05-01 MED ORDER — SODIUM CHLORIDE 0.9 % IV SOLN
1.0000 g | Freq: Once | INTRAVENOUS | Status: AC
  Administered 2018-05-01: 1 g via INTRAVENOUS
  Filled 2018-05-01: qty 10

## 2018-05-01 MED ORDER — ENOXAPARIN SODIUM 40 MG/0.4ML ~~LOC~~ SOLN
40.0000 mg | SUBCUTANEOUS | Status: DC
  Administered 2018-05-01: 40 mg via SUBCUTANEOUS
  Filled 2018-05-01: qty 0.4

## 2018-05-01 MED ORDER — SODIUM CHLORIDE 0.9% FLUSH: 3.0000 mL | INTRAVENOUS | Status: DC | PRN

## 2018-05-01 MED ORDER — CEPHALEXIN 250 MG PO CAPS
500.0000 mg | ORAL_CAPSULE | Freq: Once | ORAL | Status: AC
Start: 2018-05-01 — End: 2018-05-01
  Administered 2018-05-01: 500 mg via ORAL
  Filled 2018-05-01: qty 2

## 2018-05-01 MED ORDER — SODIUM CHLORIDE 0.9 % IV SOLN: 250.0000 mL | INTRAVENOUS | Status: DC | PRN

## 2018-05-01 MED ORDER — LACTATED RINGERS IV BOLUS
500.0000 mL | Freq: Once | INTRAVENOUS | Status: AC
  Administered 2018-05-02: 500 mL via INTRAVENOUS

## 2018-05-01 MED ORDER — PANTOPRAZOLE SODIUM 40 MG PO TBEC
40.0000 mg | DELAYED_RELEASE_TABLET | Freq: Every day | ORAL | Status: DC
  Administered 2018-05-01 – 2018-05-02 (×2): 40 mg via ORAL
  Filled 2018-05-01 (×2): qty 1

## 2018-05-01 MED ORDER — ACETAMINOPHEN 500 MG PO TABS
1000.0000 mg | ORAL_TABLET | Freq: Once | ORAL | Status: AC
  Administered 2018-05-01: 1000 mg via ORAL
  Filled 2018-05-01: qty 2

## 2018-05-01 MED ORDER — ONDANSETRON HCL 4 MG PO TABS
4.0000 mg | ORAL_TABLET | Freq: Four times a day (QID) | ORAL | Status: DC | PRN
  Administered 2018-05-02: 4 mg via ORAL
  Filled 2018-05-01: qty 1

## 2018-05-01 MED ORDER — SODIUM CHLORIDE 0.9 % IV BOLUS
1000.0000 mL | Freq: Once | INTRAVENOUS | Status: AC
  Administered 2018-05-01: 1000 mL via INTRAVENOUS
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
## 2018-05-01 NOTE — ED Triage Notes (Signed)
Pt in c/o migraine for the last three weeks, when pain gets really bad she develops n/v, decreased appetite, was taking naproxen at home without relief
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
## 2018-05-01 NOTE — Progress Notes (Signed)
Patient unintentionally pulled out IV. Attempted to restart x 2 without success. Orders put in for IV team.

## 2018-05-01 NOTE — ED Notes (Signed)
Pt given coke to drink for PO challenge  

## 2018-05-02 ENCOUNTER — Encounter (HOSPITAL_COMMUNITY): Payer: Self-pay | Admitting: General Practice

## 2018-05-02 ENCOUNTER — Other Ambulatory Visit: Payer: Self-pay

## 2018-05-02 DIAGNOSIS — Z79899 Other long term (current) drug therapy: Secondary | ICD-10-CM

## 2018-05-02 DIAGNOSIS — F1490 Cocaine use, unspecified, uncomplicated: Secondary | ICD-10-CM

## 2018-05-02 DIAGNOSIS — F1290 Cannabis use, unspecified, uncomplicated: Secondary | ICD-10-CM

## 2018-05-02 DIAGNOSIS — Z820 Family history of epilepsy and other diseases of the nervous system: Secondary | ICD-10-CM

## 2018-05-02 DIAGNOSIS — Z87448 Personal history of other diseases of urinary system: Secondary | ICD-10-CM

## 2018-05-02 LAB — BASIC METABOLIC PANEL
Anion gap: 10 (ref 5–15)
BUN: 8 mg/dL (ref 6–20)
CO2: 24 mmol/L (ref 22–32)
Calcium: 8.8 mg/dL — ABNORMAL LOW (ref 8.9–10.3)
Chloride: 107 mmol/L (ref 98–111)
Creatinine, Ser: 1.21 mg/dL — ABNORMAL HIGH (ref 0.44–1.00)
GFR calc Af Amer: >60 mL/min (ref >60)
GFR calc non Af Amer: 59 mL/min — ABNORMAL LOW (ref >60)
Glucose, Bld: 84 mg/dL (ref 70–99)
Potassium: 3.7 mmol/L (ref 3.5–5.1)
Sodium: 141 mmol/L (ref 135–145)

## 2018-05-02 LAB — COMMENT2 - HEP PANEL

## 2018-05-02 LAB — HEPATITIS C ANTIBODY (REFLEX): HCV Ab: >11 s/co ratio — ABNORMAL HIGH (ref 0.0–0.9)

## 2018-05-02 LAB — HIV ANTIBODY (ROUTINE TESTING W REFLEX): HIV Screen 4th Generation wRfx: NONREACTIVE

## 2018-05-02 MED ORDER — CEPHALEXIN 500 MG PO CAPS: 500.0000 mg | ORAL_CAPSULE | Freq: Two times a day (BID) | ORAL | 0 refills | Status: DC

## 2018-05-02 MED ORDER — ONDANSETRON HCL 4 MG PO TABS: 4.0000 mg | ORAL_TABLET | Freq: Three times a day (TID) | ORAL | 1 refills | Status: DC | PRN

## 2018-05-02 MED ORDER — LACTATED RINGERS IV BOLUS: 1000.0000 mL | Freq: Once | INTRAVENOUS | Status: DC

## 2018-05-02 MED ORDER — SUMATRIPTAN SUCCINATE 100 MG PO TABS: 100.0000 mg | ORAL_TABLET | Freq: Once | ORAL | 1 refills | Status: DC | PRN

## 2018-05-02 MED ORDER — CEPHALEXIN 500 MG PO CAPS: 500.0000 mg | ORAL_CAPSULE | Freq: Two times a day (BID) | ORAL | 0 refills | Status: AC

## 2018-05-02 MED ORDER — ENSURE ENLIVE PO LIQD
237.0000 mL | Freq: Two times a day (BID) | ORAL | Status: DC
  Administered 2018-05-02: 237 mL via ORAL

## 2018-05-02 MED ORDER — PNEUMOCOCCAL VAC POLYVALENT 25 MCG/0.5ML IJ INJ
0.5000 mL | INJECTION | Freq: Once | INTRAMUSCULAR | Status: AC
  Administered 2018-05-02: 0.5 mL via INTRAMUSCULAR
  Filled 2018-05-02: qty 0.5

## 2018-05-02 MED ORDER — ONDANSETRON HCL 4 MG PO TABS: 4.0000 mg | ORAL_TABLET | Freq: Three times a day (TID) | ORAL | 1 refills | Status: AC | PRN
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
## 2018-05-02 NOTE — Care Management Note (Signed)
Case Management Note  Patient Details  Name: Molly Thornton MRN: [id number] Date of Birth: [birth year]  Subjective/Objective:                    Action/Plan: Provided and explained MATCH letter.   [facility] June 05, 2018 at 10:50 am . Patient has contact information and transportation.   Patient voiced understanding to all of above.   Expected Discharge Date:                  Expected Discharge Plan:  Home/Self Care  In-House Referral:  Financial Counselor  Discharge planning Services  CM Consult, Medication Assistance, MATCH Program, Follow-up appt scheduled  Post Acute Care Choice:  NA Choice offered to:  Patient  DME Arranged:  N/A DME Agency:  NA  HH Arranged:  NA HH Agency:  NA  Status of Service:  In process, will continue to follow  If discussed at Long Length of Stay Meetings, dates discussed:    Additional Comments:  Kingsley PlanWile, Molly Heidt Marie, Molly Thornton 05/02/2018, 11:01 AM

## 2018-05-02 NOTE — Progress Notes (Signed)
Pt for discharge going home, discontinued peripheral IV line, no complain of pain, no vomiting noted, tolerates regular diet, given all her personal belongings, health teachings, next appointment and due meds explained and understood, waiting for her boyfriend to pick her up.

## 2018-05-02 NOTE — Progress Notes (Signed)
   Subjective: Patient states she is feeling a little better this morning. She has not vomited anymore overnight. Some minor nausea present. She is hungry and would like to try and eat. We discussed that she will need long term follow-up to determine what will work for her migraines.   Objective:  Vital signs in last 24 hours: Vitals:   05/01/18 1519 05/01/18 2115 05/02/18 0229 05/02/18 0400  BP: 117/79 102/60 111/69 128/78  Pulse: 61 67 66 (!) 53  Resp: 14 14 14 16   Temp: 98.6 F (37 C) 98.7 F (37.1 C) 98.6 F (37 C) 98.9 F (37.2 C)  TempSrc: Oral Oral Oral Oral  SpO2: 99% 100% 100% 98%  Weight:      Height:       Constitution: NAD, appears stated age, cooperative, lying supine in bed HENT: normocephalic, atraumatic, no scleral icterus, EOM intact Cardio: Regular rate & rhythm, no murmurs, rubs, gallops Respiratory: CTAB, non-labored breathing, no wheezing Abdominal: +bs, diffusely TTP, soft, non-distended, no guarding MSK: symmetric, no pitting edema  Skin: clean, dry, intact  Assessment/Plan:  Active Problems:   Nausea & vomiting   Acute nonintractable headache   Pyelonephritis  #Chronic Migraines with vomiting: Pt feeling better this morning after sumatriptan, no more vomiting. Discussed following outpatient and establishing a PCP to determine what works best for her migraines. UDS was positive for cocaine.   - try regular diet  - Sumatriptan 100mg  nasal once for migraine - zofran po q6h - Protonix qd 40mg  po - full liquid diet   #AKI: Likely secondary to decreased po intake as patient states she cannot keep anything down   - 1L NS bolus   #UTI: UA with positive bacteria, nitrites, and leuks with symptoms of hesitancy and still needing to urinate after urination. Received one dose ceftriaxone in ED.  - d/c with po keflex  #Hepatitis C: Hepatitis positive. Patient has not had follow-up with outpatient since her diagnosis. We have no available history.  She is interested in following up outpatient with the ID clinic and we will obtain labs to help refer her.   - Hep C genotype and quantitative PCR - will make appointment ID for follow-up  Dispo: Anticipated discharge today.   Jaylinn Hellenbrand A, DO 05/02/2018, 8:22 AM Pager: [phone]

## 2018-05-06 LAB — HEPATITIS C GENOTYPE: HCV Genotype: 3

## 2018-05-07 LAB — HCV RT-PCR, QUANT (NON-GRAPH)
HCV log10: 5.857 log10 IU/mL
Hepatitis C Quantitation: 719000 IU/mL

## 2018-05-07 LAB — HCV AB W REFLEX TO QUANT PCR: HCV Ab: >11 s/co ratio — ABNORMAL HIGH (ref 0.0–0.9)

## 2018-05-14 ENCOUNTER — Encounter: Payer: Self-pay | Admitting: Internal Medicine

## 2018-05-14 ENCOUNTER — Ambulatory Visit (INDEPENDENT_AMBULATORY_CARE_PROVIDER_SITE_OTHER): Payer: Self-pay | Admitting: Internal Medicine

## 2018-05-14 DIAGNOSIS — B182 Chronic viral hepatitis C: Secondary | ICD-10-CM

## 2018-05-14 MED ORDER — SOFOSBUVIR-VELPATASVIR 400-100 MG PO TABS: 1.0000 | ORAL_TABLET | Freq: Every day | ORAL | 2 refills | Status: DC

## 2018-05-14 NOTE — Patient Instructions (Signed)
Date 05/14/18  Dear Ms Molly Thornton, As discussed in the ID Clinic, your hepatitis C therapy will include the following medications:    sofosbuvir 400 mg/velpatasvir 100 mg (Epclusa) oral daily  Please note that ALL MEDICATIONS WILL START ON THE SAME DATE for a total of 12 weeks. ---------------------------------------------------------------- Your HCV Treatment Start Date: TBA   Your HCV genotype: 3    Liver Fibrosis: no cirrhosis    ---------------------------------------------------------------- YOUR PHARMACY CONTACT (depending on your insurance):   [facility] [address] [place], [place] [address] Phone: [phone] Hours: Monday to Friday 7:30 am to 6:00 pm   Please always contact your pharmacy at least 3-4 business days before you run out of medications to ensure your next month's medication is ready or 1 week prior to running out if you receive it by mail.  Remember, each prescription is for 28 days. ---------------------------------------------------------------- GENERAL NOTES REGARDING YOUR HEPATITIS C MEDICATION:  SOFOSBUVIR (SOVALDI or EPCLUSA): - Sofosbuvir 400 mg tablet is taken daily with OR without food. - The sofosbuvir tablets are yellow. - The Epclusa tablets are pink, diamond-shaped - The tablets should be stored at room temperature. - The most common side effects with sofosbuvir or Epclusa include:      1. Fatigue      2. Headache      3. Nausea      4. Diarrhea      5. Insomnia  - Acid reducing agents such as H2 blockers (ie. Pepcid (famotidine), Zantac (ranitidine), Tagamet (cimetidine), Axid (nizatidine) and proton pump inhibitors (ie. Prilosec (omeprazole), Protonix (pantoprazole), Nexium (esomeprazole), or Aciphex (rabeprazole)) can decrease effectiveness of Harvoni. Do not take until you have discussed with a health care provider.    -Antacids that contain magnesium and/or aluminum hydroxide (ie. Milk of Magensia, Rolaids, Gaviscon,  Maalox, Mylanta, an dArthritis Pain Formula)can reduce absorption of sofosbuvir, so take them at least 4 hours before or after Harvoni.  -Calcium carbonate (calcium supplements or antacids such as Tums, Caltrate, Os-Cal)needs to be taken at least 4 hours hours before or after sofosbuvir.  -St. John's wort or any products that contain St. John's wort like some herbal supplements  Please inform the office prior to starting any of these medications.   Please note that this only lists the most common side effects and is NOT a comprehensive list of the potential side effects of these medications. For more information, please review the drug information sheets that come with your medication package from the pharmacy.  ---------------------------------------------------------------- GENERAL HELPFUL HINTS ON HCV THERAPY: 1. No alcohol. 2. Stay well-hydrated 3. Notify the ID Clinic of any changes in your other over-the-counter/herbal or prescription medications. 4. If you miss a dose of your medication, take the missed dose as soon as you remember. Return to your regular time/dose schedule the next day.  5.  Do not stop taking your medications without first talking with your healthcare provider. 6.  You may take Tylenol (acetaminophen), as long as the dose is less than 2000 mg (OR no more than 4 tablets of the Tylenol Extra Strengths 500mg  tablet) in 24 hours. 7. You will follow up with our clinic pharmacist initially after starting the medication to monitor for any possible side effects 8. You will get labs once during treatment, soon after treatment completion and again 6 months or more after treatment completion to verify the virus is completely gone.   Gardiner Barefootobert W Kynli Chou, MD  [facility] [phone]  [address] [place], [place]  [address] [phone]

## 2018-05-15 LAB — HEPATITIS B CORE ANTIBODY, TOTAL: Hep B Core Total Ab: NONREACTIVE

## 2018-05-15 LAB — HEPATITIS B SURFACE ANTIGEN: Hepatitis B Surface Ag: NONREACTIVE

## 2018-05-15 LAB — HEPATITIS A ANTIBODY, TOTAL: Hepatitis A AB,Total: NONREACTIVE

## 2018-05-15 LAB — HEPATITIS B SURFACE ANTIBODY,QUALITATIVE: Hep B S Ab: REACTIVE — AB

## 2018-05-15 NOTE — Progress Notes (Signed)
[facility]   CC: consideration for treatment for chronic hepatitis C  HPI:  +Molly Thornton is a 32 y.o. female who presents for initial evaluation and management of chronic hepatitis C.  Patient tested positive over 2 years ago while in jail. Hepatitis C-associated risk factors present are: IV drug abuse (details: last used over 1 year ago). Patient denies history of blood transfusion, tattoos. Patient has had other studies performed. Results: CT of abdomen, result: no masses, no cirrhosis, hepatitis C RNA by PCR, result: positive. Patient has not had prior treatment for Hepatitis C. Patient does not have a past history of liver disease. Patient does not have a family history of liver disease. Patient does not  have associated signs or symptoms related to liver disease.  Labs reviewed and confirm chronic hepatitis C with a positive viral load.   Records reviewed from Epic during recent hospitalization and has genotype 3, chronic hepatitis C.  No hepatitis B studies done.     Patient does not have documented immunity to Hepatitis A. Patient does not have documented immunity to Hepatitis B.    Review of Systems:   Constitutional: negative for fatigue and malaise Gastrointestinal: negative for nausea and diarrhea Integument/breast: negative for rash Musculoskeletal: negative for myalgias and arthralgias All other systems reviewed and are negative       Past Medical History:  Diagnosis Date  . Anxiety   . Bipolar disorder (HCC)   . Depression   . Hepatitis C    "never tx'd" (05/02/2018)  . Migraine    "daily" (05/02/2018)  . PTSD (post-traumatic stress disorder)   . Sickle cell trait (HCC)     Prior to Admission medications   Medication Sig Start Date End Date Taking? Authorizing Provider  ondansetron (ZOFRAN) 4 MG tablet Take 1 tablet (4 mg total) by mouth every 8 (eight) hours as needed for nausea or vomiting. 05/02/18 05/02/19 Yes Seawell, Jaimie A, DO    SUMAtriptan (IMITREX) 100 MG tablet Take 1 tablet (100 mg total) by mouth once as needed for migraine. May repeat in 2 hours if headache persists or recurs. 05/02/18 05/03/19 Yes Seawell, Jaimie A, DO  Sofosbuvir-Velpatasvir (EPCLUSA) 400-100 MG TABS Take 1 tablet by mouth daily. 05/14/18   Kalon Erhardt, Belia Hemanobert W, MD    Allergies  Allergen Reactions  . Hydrocodone Hives  . Ivp Dye [Iodinated Diagnostic Agents] Rash    Social History   Tobacco Use  . Smoking status: Current Every Day Smoker    Packs/day: 0.50    Years: 17.00    Pack years: 8.50    Types: Cigarettes  . Smokeless tobacco: Never Used  Substance Use Topics  . Alcohol use: Not Currently    Frequency: Never    Comment: 05/02/2018 "nothing since 2013"  . Drug use: Yes    Frequency: 1.0 times per week    Types: Heroin, Marijuana    Comment: 05/02/2018 "clean since 08/14/2015"    FMH: no cirrhosis, no liver cancer   Objective:  Constitutional: Awake, alert, nad Today's Vitals   05/14/18 1411  Weight: 125 lb (56.7 kg)  Height: 5\' 7"  (1.702 m)  PainSc: 0-No pain   Eyes: anicteric Cardiovascular: RRR Respiratory: CTA B; normal respiratory effort Gastrointestinal: soft, nt, nd Musculoskeletal: no edema Skin: No rashes; no porphyria cutanea tarda Lymphatic: no cervical lymphadenopathy   Laboratory Genotype:  Lab Results  Component Value Date   HCVGENOTYPE 3 05/02/2018   HCV viral load: No results found  for: HCVQUANT Lab Results  Component Value Date   WBC 8.8 05/01/2018   HGB 12.1 05/01/2018   HCT 35.7 (L) 05/01/2018   MCV 86.7 05/01/2018   PLT 238 05/01/2018    Lab Results  Component Value Date   CREATININE 1.21 (H) 05/02/2018   BUN 8 05/02/2018   NA 141 05/02/2018   K 3.7 05/02/2018   CL 107 05/02/2018   CO2 24 05/02/2018    Lab Results  Component Value Date   ALT 43 05/01/2018   AST 39 05/01/2018   ALKPHOS 52 05/01/2018     Labs and history reviewed and show CHILD-PUGH unknown/no cirrhosis  5-6  points: Child class A 7-9 points: Child class B 10-15 points: Child class C  Lab Results  Component Value Date   BILITOT 0.8 05/01/2018   ALBUMIN 4.0 05/01/2018     Assessment: New Patient with Chronic Hepatitis C genotype 3, untreated.  I discussed with the patient the lab findings that confirm chronic hepatitis C as well as the natural history and progression of disease including about 30% of people who develop cirrhosis of the liver if left untreated and once cirrhosis is established there is a 2-7% risk per year of liver cancer and liver failure.  I discussed the importance of treatment and benefits in reducing the risk, even if significant liver fibrosis exists.   Plan: 1) Patient counseled extensively on limiting acetaminophen to no more than 2 grams daily, avoidance of alcohol. 2) Transmission discussed with patient including sexual transmission, sharing razors and toothbrush.   3) Will need referral to gastroenterology if concern for cirrhosis 4) Will need referral for substance abuse counseling: No.; Further work up to include urine drug screen  No. 5) Will prescribe Epclusa for 12 weeks via support path 6) Hepatitis A and B titers 7) will follow up after starting medication, can follow up with me 3-4 weeks after starting

## 2018-06-05 ENCOUNTER — Other Ambulatory Visit: Payer: Self-pay

## 2018-06-05 ENCOUNTER — Ambulatory Visit (INDEPENDENT_AMBULATORY_CARE_PROVIDER_SITE_OTHER): Payer: Self-pay | Admitting: Physician Assistant

## 2018-06-05 ENCOUNTER — Encounter: Payer: Self-pay | Admitting: Pharmacy Technician

## 2018-06-05 ENCOUNTER — Encounter (INDEPENDENT_AMBULATORY_CARE_PROVIDER_SITE_OTHER): Payer: Self-pay | Admitting: Physician Assistant

## 2018-06-05 VITALS — BP 117/72 | HR 62 | Temp 98.0°F | Ht 67.0 in | Wt 126.8 lb

## 2018-06-05 DIAGNOSIS — G43809 Other migraine, not intractable, without status migrainosus: Secondary | ICD-10-CM

## 2018-06-05 DIAGNOSIS — N63 Unspecified lump in unspecified breast: Secondary | ICD-10-CM

## 2018-06-05 DIAGNOSIS — B182 Chronic viral hepatitis C: Secondary | ICD-10-CM

## 2018-06-05 MED ORDER — TOPIRAMATE 25 MG PO TABS: 50.0000 mg | ORAL_TABLET | Freq: Every day | ORAL | 2 refills | Status: DC

## 2018-06-05 NOTE — Progress Notes (Signed)
Subjective:  Patient ID: Molly Thornton, female    DOB: May 19, 1986  Age: 32 y.o. MRN: [id number]  CC: hospital f/u  HPI Molly Thornton is a 32 y.o. female with a medical history of anxiety, depression, bipolar disorder, PTSD, HCV, chronic migraines, and sickle cell trait presents as a new patient with complaint of chronic migraine headache. She was recently prescribed sumatriptan which helps relieve migraine headaches. Has not had a migraine in a few days.     Main concern today is about a lump on the left breast. Has female family members that have breast cancer. Has a cousin younger than her that has breast cancer. Does not endorse nipple discharge, pain, or skin texture changes.     Patient has HCV but is currently undergoing evaluation and treatment for HCV with ID. Does not report any side effects with Epclusa.     Outpatient Medications Prior to Visit  Medication Sig Dispense Refill  . ondansetron (ZOFRAN) 4 MG tablet Take 1 tablet (4 mg total) by mouth every 8 (eight) hours as needed for nausea or vomiting. 30 tablet 1  . Sofosbuvir-Velpatasvir (EPCLUSA) 400-100 MG TABS Take 1 tablet by mouth daily. 28 tablet 2  . SUMAtriptan (IMITREX) 100 MG tablet Take 1 tablet (100 mg total) by mouth once as needed for migraine. May repeat in 2 hours if headache persists or recurs. 15 tablet 1   No facility-administered medications prior to visit.      ROS Review of Systems  Constitutional: Negative for chills, fever and malaise/fatigue.  Eyes: Negative for blurred vision.  Respiratory: Negative for shortness of breath.   Cardiovascular: Negative for chest pain and palpitations.  Gastrointestinal: Negative for abdominal pain and nausea.  Genitourinary: Negative for dysuria and hematuria.  Musculoskeletal: Negative for joint pain and myalgias.  Skin: Negative for rash.       Breast lump  Neurological: Negative for tingling and headaches.  Psychiatric/Behavioral: Negative for  depression. The patient is not nervous/anxious.     Objective:  BP 117/72 (BP Location: Left Arm, Patient Position: Sitting, Cuff Size: Normal)   Pulse 62   Temp 98 F (36.7 C) (Oral)   Ht 5\' 7"  (1.702 m)   Wt 126 lb 12.8 oz (57.5 kg)   LMP 05/25/2018 (Exact Date)   SpO2 100%   BMI 19.86 kg/m   BP/Weight 06/05/2018 05/14/2018 05/02/2018  Systolic BP 117 - 124  Diastolic BP 72 - 77  Wt. (Lbs) 126.8 125 -  BMI 19.86 19.58 -      Physical Exam  Constitutional: She is oriented to person, place, and time.  Well developed, well nourished, NAD, polite  HENT:  Head: Normocephalic and atraumatic.  Eyes: No scleral icterus.  Neck: Normal range of motion. Neck supple. No thyromegaly present.  Cardiovascular: Normal rate, regular rhythm and normal heart sounds.  Pulmonary/Chest: Effort normal and breath sounds normal. Right breast exhibits no inverted nipple, no mass, no nipple discharge, no skin change and no tenderness. Left breast exhibits mass (solid, freely movable mass). Left breast exhibits no inverted nipple, no nipple discharge, no skin change and no tenderness. No breast swelling. Breasts are symmetrical.    Musculoskeletal: She exhibits no edema.  Neurological: She is alert and oriented to person, place, and time.  Skin: Skin is warm and dry. No rash noted. No erythema. No pallor.  Psychiatric: She has a normal mood and affect. Her behavior is normal. Thought content normal.  Vitals reviewed.    Assessment & Plan:  1. Other migraine without status migrainosus, not intractable - Begin topiramate (TOPAMAX) 25 MG tablet; Take 2 tablets (50 mg total) by mouth at bedtime.  Dispense: 60 tablet; Refill: 2  2. Chronic hepatitis C without hepatic coma (HCC) - Continue treatment with infectious disease specialists.   3. Breast lump in female - Likely fibroadenoma - I have messaged Stoney BangSabrina Holland to possibly enroll patient into the BCCCP program.   Meds ordered this  encounter  Medications  . topiramate (TOPAMAX) 25 MG tablet    Sig: Take 2 tablets (50 mg total) by mouth at bedtime.    Dispense:  60 tablet    Refill:  2    Order Specific Question:   Supervising Provider    Answer:   Hoy RegisterNEWLIN, ENOBONG [[id number]]    Follow-up: Return in about 4 weeks (around 07/03/2018) for annual physical.   Loletta Specteroger David Deeana Atwater PA

## 2018-06-05 NOTE — Patient Instructions (Signed)

## 2018-06-06 ENCOUNTER — Telehealth (HOSPITAL_COMMUNITY): Payer: Self-pay

## 2018-06-06 NOTE — Telephone Encounter (Signed)
TRIED TO CALL LEFT A VOICEMAIL

## 2018-06-19 ENCOUNTER — Emergency Department: Payer: No Typology Code available for payment source

## 2018-06-19 ENCOUNTER — Emergency Department
Admission: EM | Admit: 2018-06-19 | Discharge: 2018-06-19 | Disposition: A | Discharge status: Home / Self Care | Payer: No Typology Code available for payment source | Attending: Emergency Medicine | Admitting: Emergency Medicine

## 2018-06-19 ENCOUNTER — Other Ambulatory Visit: Payer: Self-pay

## 2018-06-19 ENCOUNTER — Encounter: Payer: Self-pay | Admitting: Emergency Medicine

## 2018-06-19 DIAGNOSIS — F1914 Other psychoactive substance abuse with psychoactive substance-induced mood disorder: Secondary | ICD-10-CM | POA: Diagnosis not present

## 2018-06-19 DIAGNOSIS — Z79899 Other long term (current) drug therapy: Secondary | ICD-10-CM | POA: Insufficient documentation

## 2018-06-19 DIAGNOSIS — F1210 Cannabis abuse, uncomplicated: Secondary | ICD-10-CM

## 2018-06-19 DIAGNOSIS — Y929 Unspecified place or not applicable: Secondary | ICD-10-CM | POA: Insufficient documentation

## 2018-06-19 DIAGNOSIS — R4587 Impulsiveness: Secondary | ICD-10-CM

## 2018-06-19 DIAGNOSIS — S0101XA Laceration without foreign body of scalp, initial encounter: Secondary | ICD-10-CM

## 2018-06-19 DIAGNOSIS — F1310 Sedative, hypnotic or anxiolytic abuse, uncomplicated: Secondary | ICD-10-CM

## 2018-06-19 DIAGNOSIS — Z23 Encounter for immunization: Secondary | ICD-10-CM | POA: Diagnosis not present

## 2018-06-19 DIAGNOSIS — R45851 Suicidal ideations: Secondary | ICD-10-CM | POA: Diagnosis not present

## 2018-06-19 DIAGNOSIS — Y999 Unspecified external cause status: Secondary | ICD-10-CM | POA: Insufficient documentation

## 2018-06-19 DIAGNOSIS — S0990XA Unspecified injury of head, initial encounter: Secondary | ICD-10-CM | POA: Diagnosis present

## 2018-06-19 DIAGNOSIS — Y939 Activity, unspecified: Secondary | ICD-10-CM | POA: Insufficient documentation

## 2018-06-19 DIAGNOSIS — F1110 Opioid abuse, uncomplicated: Secondary | ICD-10-CM

## 2018-06-19 DIAGNOSIS — F1994 Other psychoactive substance use, unspecified with psychoactive substance-induced mood disorder: Secondary | ICD-10-CM

## 2018-06-19 DIAGNOSIS — R4689 Other symptoms and signs involving appearance and behavior: Secondary | ICD-10-CM

## 2018-06-19 DIAGNOSIS — F1410 Cocaine abuse, uncomplicated: Secondary | ICD-10-CM

## 2018-06-19 DIAGNOSIS — F17210 Nicotine dependence, cigarettes, uncomplicated: Secondary | ICD-10-CM | POA: Diagnosis not present

## 2018-06-19 DIAGNOSIS — R4589 Other symptoms and signs involving emotional state: Secondary | ICD-10-CM

## 2018-06-19 LAB — COMPREHENSIVE METABOLIC PANEL
ALT: 17 U/L (ref 0–44)
AST: 28 U/L (ref 15–41)
Albumin: 4.7 g/dL (ref 3.5–5.0)
Alkaline Phosphatase: 59 U/L (ref 38–126)
Anion gap: 8 (ref 5–15)
BUN: 17 mg/dL (ref 6–20)
CO2: 25 mmol/L (ref 22–32)
Calcium: 9.5 mg/dL (ref 8.9–10.3)
Chloride: 104 mmol/L (ref 98–111)
Creatinine, Ser: 0.96 mg/dL (ref 0.44–1.00)
GFR calc Af Amer: >60 mL/min (ref >60)
GFR calc non Af Amer: >60 mL/min (ref >60)
Glucose, Bld: 100 mg/dL — ABNORMAL HIGH (ref 70–99)
Potassium: 3.6 mmol/L (ref 3.5–5.1)
Sodium: 137 mmol/L (ref 135–145)
Total Bilirubin: 0.8 mg/dL (ref 0.3–1.2)
Total Protein: 8.3 g/dL — ABNORMAL HIGH (ref 6.5–8.1)

## 2018-06-19 LAB — URINE DRUG SCREEN, QUALITATIVE (ARMC ONLY)
Amphetamines, Ur Screen: NOT DETECTED
Barbiturates, Ur Screen: NOT DETECTED
Benzodiazepine, Ur Scrn: POSITIVE — AB
Cannabinoid 50 Ng, Ur ~~LOC~~: POSITIVE — AB
Cocaine Metabolite,Ur ~~LOC~~: POSITIVE — AB
MDMA (Ecstasy)Ur Screen: NOT DETECTED
Methadone Scn, Ur: NOT DETECTED
Opiate, Ur Screen: POSITIVE — AB
Phencyclidine (PCP) Ur S: NOT DETECTED
Tricyclic, Ur Screen: NOT DETECTED

## 2018-06-19 LAB — CBC
HCT: 37.4 % (ref 35.0–47.0)
Hemoglobin: 13.1 g/dL (ref 12.0–16.0)
MCH: 30.9 pg (ref 26.0–34.0)
MCHC: 35.1 g/dL (ref 32.0–36.0)
MCV: 87.9 fL (ref 80.0–100.0)
Platelets: 247 10*3/uL (ref 150–440)
RBC: 4.25 MIL/uL (ref 3.80–5.20)
RDW: 15.5 % — ABNORMAL HIGH (ref 11.5–14.5)
WBC: 11.4 10*3/uL — ABNORMAL HIGH (ref 3.6–11.0)

## 2018-06-19 LAB — ETHANOL: Alcohol, Ethyl (B): <10 mg/dL (ref <10)

## 2018-06-19 LAB — POCT PREGNANCY, URINE: Preg Test, Ur: NEGATIVE

## 2018-06-19 MED ORDER — OXYCODONE-ACETAMINOPHEN 5-325 MG PO TABS
1.0000 | ORAL_TABLET | Freq: Once | ORAL | Status: AC
  Administered 2018-06-19: 1 via ORAL
  Filled 2018-06-19: qty 1

## 2018-06-19 MED ORDER — ONDANSETRON HCL 4 MG/2ML IJ SOLN
4.0000 mg | Freq: Once | INTRAMUSCULAR | Status: AC
  Administered 2018-06-19: 4 mg via INTRAVENOUS
  Filled 2018-06-19: qty 2

## 2018-06-19 MED ORDER — TETANUS-DIPHTH-ACELL PERTUSSIS 5-2.5-18.5 LF-MCG/0.5 IM SUSP
0.5000 mL | Freq: Once | INTRAMUSCULAR | Status: AC
  Administered 2018-06-19: 0.5 mL via INTRAMUSCULAR
  Filled 2018-06-19: qty 0.5

## 2018-06-19 MED ORDER — IBUPROFEN 600 MG PO TABS
600.0000 mg | ORAL_TABLET | Freq: Four times a day (QID) | ORAL | Status: DC | PRN
  Administered 2018-06-19: 600 mg via ORAL
  Filled 2018-06-19: qty 1

## 2018-06-19 NOTE — ED Notes (Signed)
E signature pad not available.  
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
## 2018-06-19 NOTE — ED Notes (Signed)
Pt denies SI/HI/AVH. Pt given discharge instructions including f/u appointments. Pt states understanding. Pt states receipt of all belongings.   

## 2018-06-19 NOTE — ED Notes (Signed)
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
## 2018-06-19 NOTE — Discharge Instructions (Addendum)
Keep laceration dry and clean. Wash with warm water and soap. Apply topical bacitracin. You received 2 staples that must be removed in 7 days.  Your doctor can remove them for you or you can return to the ER for that. Watch for signs of infection: pus, redness of the skin surrounding it, or fever. If these develop see your doctor or return to the ER for antibiotics.   You have been seen in the Emergency Department (ED)  today for a psychiatric complaint.  You have been evaluated by psychiatry and we believe you are safe to be discharged from the hospital.    Please return to the Emergency Department (ED)  immediately if you have ANY thoughts of hurting yourself or anyone else, so that we may help you.  Please avoid alcohol and drug use.  Follow up with your doctor and/or therapist as soon as possible regarding today's ED  visit.   You may call crisis hotline for [facility] at [phone].

## 2018-06-19 NOTE — ED Notes (Signed)
Patient dressed out and belongings bagged by this RN and Product manager. IVC process explained to patient who expressed understanding. Belongings include (black tshirt, black jeans, black tennis shoes, white socks, black bracelet, pink bra, white bra, underwear).
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
## 2018-06-19 NOTE — ED Provider Notes (Signed)
-----------------------------------------   7:54 PM on 06/19/2018 -----------------------------------------   Blood pressure 115/76, pulse 92, temperature 98.7 F (37.1 C), temperature source Oral, resp. rate 16, height 5\' 7"  (1.702 m), weight 57 kg, last menstrual period 06/19/2018, SpO2 99 %.  Patient has been evaluated by psychiatry and cleared for discharge. IVC lifted by Dr. Toni Amend. Patient's labs have been reviewed with no acute findings. Patient will be discharged at this time to home    Don Perking, [place], MD 06/19/18 1954
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
## 2018-06-27 ENCOUNTER — Telehealth (HOSPITAL_COMMUNITY): Payer: Self-pay

## 2018-06-27 NOTE — Telephone Encounter (Signed)
Called left a message to call [facility] °

## 2018-07-07 ENCOUNTER — Ambulatory Visit: Payer: Self-pay

## 2018-07-07 ENCOUNTER — Ambulatory Visit (INDEPENDENT_AMBULATORY_CARE_PROVIDER_SITE_OTHER): Payer: Self-pay | Admitting: Physician Assistant

## 2018-07-14 ENCOUNTER — Telehealth: Payer: Self-pay | Admitting: Pharmacy Technician

## 2018-07-14 NOTE — Telephone Encounter (Signed)
[facility] called.  Molly Thornton was evicted from her home and could not retrieve her Epclusa.  Donnie Aho is working to get her an emergency bottle if possible.  She has missed 4 days already.

## 2018-07-17 ENCOUNTER — Encounter (INDEPENDENT_AMBULATORY_CARE_PROVIDER_SITE_OTHER): Payer: Self-pay

## 2018-07-18 ENCOUNTER — Telehealth (HOSPITAL_COMMUNITY): Payer: Self-pay

## 2018-07-18 NOTE — Telephone Encounter (Signed)
Call could not be completed at the time °

## 2018-07-25 ENCOUNTER — Telehealth (HOSPITAL_COMMUNITY): Payer: Self-pay

## 2018-07-25 NOTE — Telephone Encounter (Signed)
Call could not be completed. 

## 2019-05-01 ENCOUNTER — Ambulatory Visit (INDEPENDENT_AMBULATORY_CARE_PROVIDER_SITE_OTHER): Payer: Self-pay | Admitting: Primary Care

## 2019-11-26 ENCOUNTER — Inpatient Hospital Stay (HOSPITAL_COMMUNITY): Payer: Medicaid - Out of State

## 2019-11-26 ENCOUNTER — Other Ambulatory Visit: Payer: Self-pay

## 2019-11-26 ENCOUNTER — Inpatient Hospital Stay (HOSPITAL_COMMUNITY)
Admission: AD | Admit: 2019-11-26 | Discharge: 2019-11-26 | Disposition: A | Discharge status: Home / Self Care | Payer: Medicaid - Out of State | Attending: Obstetrics & Gynecology | Admitting: Obstetrics & Gynecology

## 2019-11-26 ENCOUNTER — Encounter (HOSPITAL_COMMUNITY): Payer: Self-pay | Admitting: Obstetrics & Gynecology

## 2019-11-26 DIAGNOSIS — O99331 Smoking (tobacco) complicating pregnancy, first trimester: Secondary | ICD-10-CM | POA: Diagnosis not present

## 2019-11-26 DIAGNOSIS — O26899 Other specified pregnancy related conditions, unspecified trimester: Secondary | ICD-10-CM

## 2019-11-26 DIAGNOSIS — R109 Unspecified abdominal pain: Secondary | ICD-10-CM

## 2019-11-26 DIAGNOSIS — R1030 Lower abdominal pain, unspecified: Secondary | ICD-10-CM | POA: Insufficient documentation

## 2019-11-26 DIAGNOSIS — F17210 Nicotine dependence, cigarettes, uncomplicated: Secondary | ICD-10-CM | POA: Diagnosis not present

## 2019-11-26 DIAGNOSIS — O26891 Other specified pregnancy related conditions, first trimester: Secondary | ICD-10-CM | POA: Diagnosis not present

## 2019-11-26 DIAGNOSIS — R102 Pelvic and perineal pain: Secondary | ICD-10-CM | POA: Insufficient documentation

## 2019-11-26 DIAGNOSIS — Z3A01 Less than 8 weeks gestation of pregnancy: Secondary | ICD-10-CM | POA: Diagnosis not present

## 2019-11-26 DIAGNOSIS — O3680X0 Pregnancy with inconclusive fetal viability, not applicable or unspecified: Secondary | ICD-10-CM | POA: Diagnosis not present

## 2019-11-26 DIAGNOSIS — Z975 Presence of (intrauterine) contraceptive device: Secondary | ICD-10-CM

## 2019-11-26 LAB — COMPREHENSIVE METABOLIC PANEL
ALT: 13 U/L (ref 0–44)
AST: 19 U/L (ref 15–41)
Albumin: 4.2 g/dL (ref 3.5–5.0)
Alkaline Phosphatase: 71 U/L (ref 38–126)
Anion gap: 11 (ref 5–15)
BUN: 10 mg/dL (ref 6–20)
CO2: 23 mmol/L (ref 22–32)
Calcium: 9.5 mg/dL (ref 8.9–10.3)
Chloride: 102 mmol/L (ref 98–111)
Creatinine, Ser: 0.88 mg/dL (ref 0.44–1.00)
GFR calc Af Amer: >60 mL/min (ref >60)
GFR calc non Af Amer: >60 mL/min (ref >60)
Glucose, Bld: 96 mg/dL (ref 70–99)
Potassium: 3.9 mmol/L (ref 3.5–5.1)
Sodium: 136 mmol/L (ref 135–145)
Total Bilirubin: 0.7 mg/dL (ref 0.3–1.2)
Total Protein: 7.7 g/dL (ref 6.5–8.1)

## 2019-11-26 LAB — WET PREP, GENITAL
Sperm: NONE SEEN
Trich, Wet Prep: NONE SEEN
Yeast Wet Prep HPF POC: NONE SEEN

## 2019-11-26 LAB — HCG, QUANTITATIVE, PREGNANCY: hCG, Beta Chain, Quant, S: 13967 m[IU]/mL — ABNORMAL HIGH (ref <5)

## 2019-11-26 LAB — CBC
HCT: 39 % (ref 36.0–46.0)
Hemoglobin: 13.6 g/dL (ref 12.0–15.0)
MCH: 30.2 pg (ref 26.0–34.0)
MCHC: 34.9 g/dL (ref 30.0–36.0)
MCV: 86.5 fL (ref 80.0–100.0)
Platelets: 310 10*3/uL (ref 150–400)
RBC: 4.51 MIL/uL (ref 3.87–5.11)
RDW: 14 % (ref 11.5–15.5)
WBC: 11 10*3/uL — ABNORMAL HIGH (ref 4.0–10.5)
nRBC: 0 % (ref 0.0–0.2)

## 2019-11-26 LAB — POC URINE PREG, ED: Preg Test, Ur: POSITIVE — AB

## 2019-11-26 LAB — ABO/RH: ABO/RH(D): O POS

## 2019-11-26 NOTE — MAU Note (Signed)
Had 'essure' implant placed in 2010.  Had 2 periods in Jan, no period in Feb. +HPT 3 days ago, confirmed in ER. (they transferred pt to [place] for eval).  Started feeling nauseated last night.  Has been cramping every day for the last month. No bleeding.

## 2019-11-26 NOTE — Discharge Instructions (Signed)
Abdominal Pain During Pregnancy  Abdominal pain is common during pregnancy, and has many possible causes. Some causes are more serious than others, and sometimes the cause is not known. Abdominal pain can be a sign that labor is starting. It can also be caused by normal growth and stretching of muscles and ligaments during pregnancy. Always tell your health care provider if you have any abdominal pain. Follow these instructions at home:  Do not have sex or put anything in your vagina until your pain goes away completely.  Get plenty of rest until your pain improves.  Drink enough fluid to keep your urine pale yellow.  Take over-the-counter and prescription medicines only as told by your health care provider.  Keep all follow-up visits as told by your health care provider. This is important. Contact a health care provider if:  Your pain continues or gets worse after resting.  You have lower abdominal pain that: ? Comes and goes at regular intervals. ? Spreads to your back. ? Is similar to menstrual cramps.  You have pain or burning when you urinate. Get help right away if:  You have a fever or chills.  You have vaginal bleeding.  You are leaking fluid from your vagina.  You are passing tissue from your vagina.  You have vomiting or diarrhea that lasts for more than 24 hours.  Your baby is moving less than usual.  You feel very weak or faint.  You have shortness of breath.  You develop severe pain in your upper abdomen. Summary  Abdominal pain is common during pregnancy, and has many possible causes.  If you experience abdominal pain during pregnancy, tell your health care provider right away.  Follow your health care provider's home care instructions and keep all follow-up visits as directed. This information is not intended to replace advice given to you by your health care provider. Make sure you discuss any questions you have with your health care  provider. Document Revised: 12/29/2018 Document Reviewed: 12/13/2016 Elsevier Patient Education  [place] of Pregnancy The first trimester of pregnancy is from week 1 until the end of week 13 (months 1 through 3). A week after a sperm fertilizes an egg, the egg will implant on the wall of the uterus. This embryo will begin to develop into a baby. Genes from you and your partner will form the baby. The female genes will determine whether the baby will be a boy or a girl. At 6-8 weeks, the eyes and face will be formed, and the heartbeat can be seen on ultrasound. At the end of 12 weeks, all the baby's organs will be formed. Now that you are pregnant, you will want to do everything you can to have a healthy baby. Two of the most important things are to get good prenatal care and to follow your health care provider's instructions. Prenatal care is all the medical care you receive before the baby's birth. This care will help prevent, find, and treat any problems during the pregnancy and childbirth. Body changes during your first trimester Your body goes through many changes during pregnancy. The changes vary from woman to woman.  You may gain or lose a couple of pounds at first.  You may feel sick to your stomach (nauseous) and you may throw up (vomit). If the vomiting is uncontrollable, call your health care provider.  You may tire easily.  You may develop headaches that can be relieved by medicines. All medicines should  be approved by your health care provider.  You may urinate more often. Painful urination may mean you have a bladder infection.  You may develop heartburn as a result of your pregnancy.  You may develop constipation because certain hormones are causing the muscles that push stool through your intestines to slow down.  You may develop hemorrhoids or swollen veins (varicose veins).  Your breasts may begin to grow larger and become tender. Your nipples  may stick out more, and the tissue that surrounds them (areola) may become darker.  Your gums may bleed and may be sensitive to brushing and flossing.  Dark spots or blotches (chloasma, mask of pregnancy) may develop on your face. This will likely fade after the baby is born.  Your menstrual periods will stop.  You may have a loss of appetite.  You may develop cravings for certain kinds of food.  You may have changes in your emotions from day to day, such as being excited to be pregnant or being concerned that something may go wrong with the pregnancy and baby.  You may have more vivid and strange dreams.  You may have changes in your hair. These can include thickening of your hair, rapid growth, and changes in texture. Some women also have hair loss during or after pregnancy, or hair that feels dry or thin. Your hair will most likely return to normal after your baby is born. What to expect at prenatal visits During a routine prenatal visit:  You will be weighed to make sure you and the baby are growing normally.  Your blood pressure will be taken.  Your abdomen will be measured to track your baby's growth.  The fetal heartbeat will be listened to between weeks 10 and 14 of your pregnancy.  Test results from any previous visits will be discussed. Your health care provider may ask you:  How you are feeling.  If you are feeling the baby move.  If you have had any abnormal symptoms, such as leaking fluid, bleeding, severe headaches, or abdominal cramping.  If you are using any tobacco products, including cigarettes, chewing tobacco, and electronic cigarettes.  If you have any questions. Other tests that may be performed during your first trimester include:  Blood tests to find your blood type and to check for the presence of any previous infections. The tests will also be used to check for low iron levels (anemia) and protein on red blood cells (Rh antibodies). Depending on  your risk factors, or if you previously had diabetes during pregnancy, you may have tests to check for high blood sugar that affects pregnant women (gestational diabetes).  Urine tests to check for infections, diabetes, or protein in the urine.  An ultrasound to confirm the proper growth and development of the baby.  Fetal screens for spinal cord problems (spina bifida) and Down syndrome.  HIV (human immunodeficiency virus) testing. Routine prenatal testing includes screening for HIV, unless you choose not to have this test.  You may need other tests to make sure you and the baby are doing well. Follow these instructions at home: Medicines  Follow your health care provider's instructions regarding medicine use. Specific medicines may be either safe or unsafe to take during pregnancy.  Take a prenatal vitamin that contains at least 600 micrograms (mcg) of folic acid.  If you develop constipation, try taking a stool softener if your health care provider approves. Eating and drinking   Eat a balanced diet that includes fresh fruits  and vegetables, whole grains, good sources of protein such as meat, eggs, or tofu, and low-fat dairy. Your health care provider will help you determine the amount of weight gain that is right for you.  Avoid raw meat and uncooked cheese. These carry germs that can cause birth defects in the baby.  Eating four or five small meals rather than three large meals a day may help relieve nausea and vomiting. If you start to feel nauseous, eating a few soda crackers can be helpful. Drinking liquids between meals, instead of during meals, also seems to help ease nausea and vomiting.  Limit foods that are high in fat and processed sugars, such as fried and sweet foods.  To prevent constipation: ? Eat foods that are high in fiber, such as fresh fruits and vegetables, whole grains, and beans. ? Drink enough fluid to keep your urine clear or pale  yellow. Activity  Exercise only as directed by your health care provider. Most women can continue their usual exercise routine during pregnancy. Try to exercise for 30 minutes at least 5 days a week. Exercising will help you: ? Control your weight. ? Stay in shape. ? Be prepared for labor and delivery.  Experiencing pain or cramping in the lower abdomen or lower back is a good sign that you should stop exercising. Check with your health care provider before continuing with normal exercises.  Try to avoid standing for long periods of time. Move your legs often if you must stand in one place for a long time.  Avoid heavy lifting.  Wear low-heeled shoes and practice good posture.  You may continue to have sex unless your health care provider tells you not to. Relieving pain and discomfort  Wear a good support bra to relieve breast tenderness.  Take warm sitz baths to soothe any pain or discomfort caused by hemorrhoids. Use hemorrhoid cream if your health care provider approves.  Rest with your legs elevated if you have leg cramps or low back pain.  If you develop varicose veins in your legs, wear support hose. Elevate your feet for 15 minutes, 3-4 times a day. Limit salt in your diet. Prenatal care  Schedule your prenatal visits by the twelfth week of pregnancy. They are usually scheduled monthly at first, then more often in the last 2 months before delivery.  Write down your questions. Take them to your prenatal visits.  Keep all your prenatal visits as told by your health care provider. This is important. Safety  Wear your seat belt at all times when driving.  Make a list of emergency phone numbers, including numbers for family, friends, the hospital, and police and fire departments. General instructions  Ask your health care provider for a referral to a local prenatal education class. Begin classes no later than the beginning of month 6 of your pregnancy.  Ask for help if  you have counseling or nutritional needs during pregnancy. Your health care provider can offer advice or refer you to specialists for help with various needs.  Do not use hot tubs, steam rooms, or saunas.  Do not douche or use tampons or scented sanitary pads.  Do not cross your legs for long periods of time.  Avoid cat litter boxes and soil used by cats. These carry germs that can cause birth defects in the baby and possibly loss of the fetus by miscarriage or stillbirth.  Avoid all smoking, herbs, alcohol, and medicines not prescribed by your health care provider. Chemicals in  these products affect the formation and growth of the baby.  Do not use any products that contain nicotine or tobacco, such as cigarettes and e-cigarettes. If you need help quitting, ask your health care provider. You may receive counseling support and other resources to help you quit.  Schedule a dentist appointment. At home, brush your teeth with a soft toothbrush and be gentle when you floss. Contact a health care provider if:  You have dizziness.  You have mild pelvic cramps, pelvic pressure, or nagging pain in the abdominal area.  You have persistent nausea, vomiting, or diarrhea.  You have a bad smelling vaginal discharge.  You have pain when you urinate.  You notice increased swelling in your face, hands, legs, or ankles.  You are exposed to fifth disease or chickenpox.  You are exposed to German measles (rubella) and have never had it. Get help right away if:  You have a fever.  You are leaking fluid from your vagina.  You have spotting or bleeding from your vagina.  You have severe abdominal cramping or pain.  You have rapid weight gain or loss.  You vomit blood or material that looks like coffee grounds.  You develop a severe headache.  You have shortness of breath.  You have any kind of trauma, such as from a fall or a car accident. Summary  The first trimester of pregnancy is  from week 1 until the end of week 13 (months 1 through 3).  Your body goes through many changes during pregnancy. The changes vary from woman to woman.  You will have routine prenatal visits. During those visits, your health care provider will examine you, discuss any test results you may have, and talk with you about how you are feeling. This information is not intended to replace advice given to you by your health care provider. Make sure you discuss any questions you have with your health care provider. Document Revised: 08/23/2017 Document Reviewed: 08/22/2016 Elsevier Patient Education  2020 [employer]  

## 2019-11-26 NOTE — MAU Provider Note (Signed)
Chief Complaint: Abdominal Pain and Nausea   First Provider Initiated Contact with Patient 11/26/19 2018        SUBJECTIVE HPI: Molly Thornton is a 34 y.o. [id number] at [redacted]w[redacted]d by LMP who presents to maternity admissions reporting pelvic cramping which got stronger today. No bleeding.  Some nausea.  Had Essure placed in 2010. Marland Kitchen She denies vaginal bleeding, vaginal itching/burning, urinary symptoms, h/a, dizziness, or fever/chills.    Abdominal Pain This is a new problem. The current episode started 1 to 4 weeks ago. The onset quality is gradual. The problem occurs intermittently. The problem has been unchanged. The pain is located in the suprapubic region, LLQ and RLQ. The pain is moderate. The quality of the pain is cramping. The abdominal pain does not radiate. Associated symptoms include nausea. Pertinent negatives include no constipation, diarrhea, dysuria, fever, frequency, myalgias or vomiting. Nothing aggravates the pain. The pain is relieved by nothing. She has tried nothing for the symptoms.   RN Note: Had 'essure' implant placed in 2010.  Had 2 periods in Jan, no period in Feb. +HPT 3 days ago, confirmed in ER. (they transferred pt to [place] for eval).  Started feeling nauseated last night.  Has been cramping every day for the last month. No bleeding.  Past Medical History:  Diagnosis Date  . Anxiety   . Bipolar disorder ([place])   . Depression   . Hepatitis C    "never tx'd" (05/02/2018)  . Migraine    "daily" (05/02/2018)  . PTSD (post-traumatic stress disorder)   . Sickle cell trait [facility])    Past Surgical History:  Procedure Laterality Date  . NO PAST SURGERIES    . WISDOM TOOTH EXTRACTION     History of cocaine, opiate, THC and benzo use 2019  Social History   Socioeconomic History  . Marital status: Single    Spouse name: Not on file  . Number of children: Not on file  . Years of education: Not on file  . Highest education level: Not on file  Occupational History  . Not  on file  Tobacco Use  . Smoking status: Current Every Day Smoker    Packs/day: 0.50    Years: 17.00    Pack years: 8.50    Types: Cigarettes  . Smokeless tobacco: Never Used  Substance and Sexual Activity  . Alcohol use: Not Currently    Comment: 05/02/2018 "nothing since 2013"  . Drug use: Yes    Frequency: 1.0 times per week    Types: Heroin, Marijuana, Oxycodone    Comment: 05/02/2018 "clean since 08/14/2015" reports she occasionally takes percocet because of her foot   . Sexual activity: Yes  Other Topics Concern  . Not on file  Social History Narrative  . Not on file   Social Determinants of Health   Financial Resource Strain:   . Difficulty of Paying Living Expenses: Not on file  Food Insecurity:   . Worried About Charity fundraiser in the Last Year: Not on file  . Ran Out of Food in the Last Year: Not on file  Transportation Needs:   . Lack of Transportation (Medical): Not on file  . Lack of Transportation (Non-Medical): Not on file  Physical Activity:   . Days of Exercise per Week: Not on file  . Minutes of Exercise per Session: Not on file  Stress:   . Feeling of Stress : Not on file  Social Connections:   . Frequency of Communication with Friends and  Family: Not on file  . Frequency of Social Gatherings with Friends and Family: Not on file  . Attends Religious Services: Not on file  . Active Member of Clubs or Organizations: Not on file  . Attends Banker Meetings: Not on file  . Marital Status: Not on file  Intimate Partner Violence:   . Fear of Current or Ex-Partner: Not on file  . Emotionally Abused: Not on file  . Physically Abused: Not on file  . Sexually Abused: Not on file   No current facility-administered medications on file prior to encounter.   Current Outpatient Medications on File Prior to Encounter  Medication Sig Dispense Refill  . Sofosbuvir-Velpatasvir (EPCLUSA) 400-100 MG TABS Take 1 tablet by mouth daily. 28 tablet 2  .  SUMAtriptan (IMITREX) 100 MG tablet Take 1 tablet (100 mg total) by mouth once as needed for migraine. May repeat in 2 hours if headache persists or recurs. 15 tablet 1  . topiramate (TOPAMAX) 25 MG tablet Take 2 tablets (50 mg total) by mouth at bedtime. 60 tablet 2   Allergies  Allergen Reactions  . Hydrocodone Hives  . Ivp Dye [Iodinated Diagnostic Agents] Rash    I have reviewed patient's Past Medical Hx, Surgical Hx, Family Hx, Social Hx, medications and allergies.   ROS:  Review of Systems  Constitutional: Negative for fever.  Gastrointestinal: Positive for abdominal pain and nausea. Negative for constipation, diarrhea and vomiting.  Genitourinary: Negative for dysuria and frequency.  Musculoskeletal: Negative for myalgias.   Review of Systems  Other systems negative   Physical Exam  Physical Exam Patient Vitals for the past 24 hrs:  BP Temp Temp src Pulse Resp SpO2 Height Weight  11/26/19 1900 139/74 99.7 F (37.6 C) Oral 81 16 100 % 5\' 7"  (1.702 m) 64 kg   Constitutional: Well-developed, well-nourished female in no acute distress.  Cardiovascular: normal rate Respiratory: normal effort GI: Abd soft, non-tender. Pos BS x 4 MS: Extremities nontender, no edema, normal ROM Neurologic: Alert and oriented x 4.  GU: Neg CVAT.  PELVIC EXAM: Cervix 0/long/high, firm, anterior, neg CMT, uterus mildly tender, nonenlarged, adnexa without tenderness, enlargement, or mass  LAB RESULTS Results for orders placed or performed during the hospital encounter of 11/26/19 (from the past 24 hour(s))  POC Urine Pregnancy, ED (not at [facility])     Status: Abnormal   Collection Time: 11/26/19  5:35 PM  Result Value Ref Range   Preg Test, Ur POSITIVE (A) NEGATIVE  CBC     Status: Abnormal   Collection Time: 11/26/19  7:26 PM  Result Value Ref Range   WBC 11.0 (H) 4.0 - 10.5 K/uL   RBC 4.51 3.87 - 5.11 MIL/uL   Hemoglobin 13.6 12.0 - 15.0 g/dL   HCT 01/26/20 16.6 - 06.3 %   MCV 86.5 80.0 -  100.0 fL   MCH 30.2 26.0 - 34.0 pg   MCHC 34.9 30.0 - 36.0 g/dL   RDW 01.6 01.0 - 93.2 %   Platelets 310 150 - 400 K/uL   nRBC 0.0 0.0 - 0.2 %  Comprehensive metabolic panel     Status: None   Collection Time: 11/26/19  7:26 PM  Result Value Ref Range   Sodium 136 135 - 145 mmol/L   Potassium 3.9 3.5 - 5.1 mmol/L   Chloride 102 98 - 111 mmol/L   CO2 23 22 - 32 mmol/L   Glucose, Bld 96 70 - 99 mg/dL   BUN 10 6 -  20 mg/dL   Creatinine, Ser 3.55 0.44 - 1.00 mg/dL   Calcium 9.5 8.9 - 73.2 mg/dL   Total Protein 7.7 6.5 - 8.1 g/dL   Albumin 4.2 3.5 - 5.0 g/dL   AST 19 15 - 41 U/L   ALT 13 0 - 44 U/L   Alkaline Phosphatase 71 38 - 126 U/L   Total Bilirubin 0.7 0.3 - 1.2 mg/dL   GFR calc non Af Amer >60 >60 mL/min   GFR calc Af Amer >60 >60 mL/min   Anion gap 11 5 - 15  ABO/Rh     Status: None   Collection Time: 11/26/19  7:26 PM  Result Value Ref Range   ABO/RH(D) O POS    No rh immune globuloin      NOT A RH IMMUNE GLOBULIN CANDIDATE, PT RH POSITIVE Performed at [facility], [address], [place], [place] [address]   hCG, quantitative, pregnancy     Status: Abnormal   Collection Time: 11/26/19  7:26 PM  Result Value Ref Range   hCG, Beta Chain, Quant, S 13,967 (H) <5 mIU/mL  Wet prep, genital     Status: Abnormal   Collection Time: 11/26/19  8:30 PM   Specimen: Vaginal  Result Value Ref Range   Yeast Wet Prep HPF POC NONE SEEN NONE SEEN   Trich, Wet Prep NONE SEEN NONE SEEN   Clue Cells Wet Prep HPF POC PRESENT (A) NONE SEEN   WBC, Wet Prep HPF POC FEW (A) NONE SEEN   Sperm NONE SEEN    --/--/O POS (03/04 1926)  IMAGING US OB LESS THAN 14 WEEKS WITH OB TRANSVAGINAL  Result Date: 11/26/2019 CLINICAL DATA:  Initial evaluation for vaginal spotting, cramping. Early pregnancy. EXAM: OBSTETRIC <14 WK [place] AND TRANSVAGINAL OB US TECHNIQUE: Both transabdominal and transvaginal ultrasound examinations were performed for complete evaluation of the gestation as well as the  maternal uterus, adnexal regions, and pelvic cul-de-sac. Transvaginal technique was performed to assess early pregnancy. COMPARISON:  None. FINDINGS: Intrauterine gestational sac: Single Yolk sac:  Present Embryo:  Present Cardiac Activity: Negative. Heart Rate: Negative. CRL: 3.1 mm   5 w   6 d                  [place] EDC: 07/23/2020 Subchorionic hemorrhage:  None visualized. Maternal uterus/adnexae: Ovaries are normal in appearance bilaterally. Left-sided Essure device partially visualized. Right-sided Essure not seen. No free fluid. IMPRESSION: 1. Single intrauterine gestational sac containing internal yolk sac and embryo, but no cardiac activity yet detectable. Crown-rump length measures 3.1 mm. Findings are suspicious but not yet definitive for failed pregnancy. Recommend follow-up US in 10-14 days for definitive diagnosis. This recommendation follows SRU consensus guidelines: Diagnostic Criteria for Nonviable Pregnancy Early in the First Trimester. Malva Limes Med 2013; 270:6237-62. 2. No other acute maternal uterine or adnexal abnormality identified. 3. Left-sided Essure device in place, right-sided Essure not visualized. Electronically Signed   By: Rise Mu M.D.   On: 11/26/2019 21:16    MAU Management/MDM: Ordered usual first trimester r/o ectopic labs.   Pelvic exam and cultures done Will check baseline Ultrasound to rule out ectopic.  This bleeding/pain can represent a normal pregnancy with bleeding, spontaneous abortion or even an ectopic which can be life-threatening.  The process as listed above helps to determine which of these is present.  Reviewed findings with patient . She is hopeful the pregnancy is healthy, but understands we cannot be sure of  that yet.  She is relieved it is not an ectopic.   Plan recheck [place] in about 10 days for viability   ASSESSMENT 1. Abdominal pain in pregnancy   2.     Pregnancy of unknown location,now seen to be IUP 3.      Conceived while Essure in  place  PLAN Discharge home Will repeat  Ultrasound in about 7-10 days for viability SAB precautions Pt stable at time of discharge. Encouraged to return here or to other Urgent Care/ED if she develops worsening of symptoms, increase in pain, fever, or other concerning symptoms.    Wynelle Bourgeois CNM, MSN Certified Nurse-Midwife 11/26/2019  8:18 PM

## 2019-11-26 NOTE — ED Provider Notes (Signed)
MSE was initiated and I personally evaluated the patient and placed orders (if any) at  5:59 PM on November 26, 2019.  35 year old female who presents to the ED today complaining of abdominal cramping for the last month intermittently.  Took at home menses test in the last couple of days which was pregnant.  Patient has essure implant and did not believe she could get pregnant.  She states that she had 2 menses in January which is atypical for her.  She states she had 1 menses that lasted approximately 4 days from 1/1-1/5.  She states she started bleeding again 1/15.  Did not have a menstrual cycle in February. [id number].   Urine pregnancy test positive in the ED today.  Even symptoms as well as patient having Essure implant and she will need to be seen at the MAU.   Discussed case with Joni Reining at the MA who agrees to accept patient for further evaluation.  The patient appears stable so that the remainder of the MSE may be completed by another provider.   Tanda Rockers, PA-C 11/26/19 1804    Geoffery Lyons, MD 11/26/19 [phone]

## 2019-11-26 NOTE — ED Triage Notes (Signed)
Pt reports abd cramps for the past few days along with vomiting all day today. Pt taking birth control but had a positive home pregnancy test.

## 2019-11-27 LAB — GC/CHLAMYDIA PROBE AMP (~~LOC~~) NOT AT ARMC
Chlamydia: NEGATIVE
Comment: NEGATIVE
Comment: NORMAL
Neisseria Gonorrhea: NEGATIVE

## 2019-12-03 ENCOUNTER — Ambulatory Visit (INDEPENDENT_AMBULATORY_CARE_PROVIDER_SITE_OTHER): Payer: PRIVATE HEALTH INSURANCE

## 2019-12-03 ENCOUNTER — Other Ambulatory Visit: Payer: Self-pay

## 2019-12-03 DIAGNOSIS — O3680X0 Pregnancy with inconclusive fetal viability, not applicable or unspecified: Secondary | ICD-10-CM

## 2019-12-03 NOTE — Progress Notes (Signed)
Pt here today following visit to MAU on 11/26/19 for abdominal pain in pregnancy. [place] during that visit showed IUP, however it was inconclusive for fetal viability. Pt reports right sided cramping today. Denies any bleeding or severe pain. Pt advised to return to the MAU for any bleeding like a period or severe pain.  Pt's recent hx reviewed with Vergie Living, MD who states pt needs an [place] in 1 week with a provider visit following to discuss results. Transvaginal US scheduled for 12/10/19 at 0900 and pt notified to arrive at 0845.   Fleet Contras RN 12/03/19

## 2019-12-06 NOTE — Progress Notes (Signed)
Patient seen and assessed by nursing staff during this encounter. I have reviewed the chart and agree with the documentation and plan.  Ercil Cassis, MD 12/06/2019 7:29 PM   

## 2019-12-10 ENCOUNTER — Telehealth: Payer: Self-pay | Admitting: Obstetrics and Gynecology

## 2019-12-10 ENCOUNTER — Ambulatory Visit (HOSPITAL_COMMUNITY): Admission: RE | Admit: 2019-12-10 | Payer: PRIVATE HEALTH INSURANCE | Source: Ambulatory Visit

## 2019-12-10 ENCOUNTER — Ambulatory Visit: Payer: PRIVATE HEALTH INSURANCE | Admitting: Obstetrics and Gynecology

## 2019-12-10 NOTE — Telephone Encounter (Signed)
Patient returned my phone call and we discussed her insurance not covering her ultrasound due to it being a [facility] policy and she would have to give them a call to update the information. Patient verbalized understanding. Patient wanted to know what she needs to do until we can get her ultrasound rescheduled because it was suppose to be for her baby's heartbeat. Spoke with Lyla Son, RN about this matter and she instructed that the patient did not have to do anything until she could get her ultrasound rescheduled. Patient instructed that if she experiences any bleeding or pain to return to MAU. Patient verbalized understanding.

## 2019-12-10 NOTE — Telephone Encounter (Signed)
Called patient's insurance [phone]) to obtain a preauth for her u/s scheduled on 3/18 @ 9:00am. After speaking with the patient's insurance company her policy is in [place] and they will not approve anything that is done in [place]. The patient will have to call her insurance company and update her address and policy in order to have services done in [place] and it be covered. Attempted to contact patient with this information and was unable to reach her. A voicemail was left for the patient to give the office a call back so I could update her with this information. Will try to reach out to the patient again at a later time/date.

## 2019-12-19 ENCOUNTER — Inpatient Hospital Stay (HOSPITAL_COMMUNITY): Payer: PRIVATE HEALTH INSURANCE

## 2019-12-19 ENCOUNTER — Encounter (HOSPITAL_COMMUNITY): Payer: Self-pay | Admitting: Emergency Medicine

## 2019-12-19 ENCOUNTER — Inpatient Hospital Stay (HOSPITAL_COMMUNITY)
Admission: AD | Admit: 2019-12-19 | Discharge: 2019-12-20 | Disposition: A | Discharge status: Home / Self Care | Payer: PRIVATE HEALTH INSURANCE | Attending: Obstetrics & Gynecology | Admitting: Obstetrics & Gynecology

## 2019-12-19 ENCOUNTER — Other Ambulatory Visit: Payer: Self-pay

## 2019-12-19 DIAGNOSIS — O4690 Antepartum hemorrhage, unspecified, unspecified trimester: Secondary | ICD-10-CM

## 2019-12-19 DIAGNOSIS — O021 Missed abortion: Secondary | ICD-10-CM

## 2019-12-19 DIAGNOSIS — Z885 Allergy status to narcotic agent status: Secondary | ICD-10-CM | POA: Diagnosis not present

## 2019-12-19 DIAGNOSIS — Z3A09 9 weeks gestation of pregnancy: Secondary | ICD-10-CM | POA: Insufficient documentation

## 2019-12-19 DIAGNOSIS — O209 Hemorrhage in early pregnancy, unspecified: Secondary | ICD-10-CM | POA: Diagnosis present

## 2019-12-19 DIAGNOSIS — F17210 Nicotine dependence, cigarettes, uncomplicated: Secondary | ICD-10-CM | POA: Diagnosis not present

## 2019-12-19 DIAGNOSIS — O99331 Smoking (tobacco) complicating pregnancy, first trimester: Secondary | ICD-10-CM | POA: Insufficient documentation

## 2019-12-19 LAB — BASIC METABOLIC PANEL
Anion gap: 10 (ref 5–15)
BUN: 14 mg/dL (ref 6–20)
CO2: 22 mmol/L (ref 22–32)
Calcium: 9.3 mg/dL (ref 8.9–10.3)
Chloride: 102 mmol/L (ref 98–111)
Creatinine, Ser: 0.83 mg/dL (ref 0.44–1.00)
GFR calc Af Amer: >60 mL/min (ref >60)
GFR calc non Af Amer: >60 mL/min (ref >60)
Glucose, Bld: 95 mg/dL (ref 70–99)
Potassium: 4.2 mmol/L (ref 3.5–5.1)
Sodium: 134 mmol/L — ABNORMAL LOW (ref 135–145)

## 2019-12-19 LAB — CBC
HCT: 35.7 % — ABNORMAL LOW (ref 36.0–46.0)
Hemoglobin: 12.2 g/dL (ref 12.0–15.0)
MCH: 30.5 pg (ref 26.0–34.0)
MCHC: 34.2 g/dL (ref 30.0–36.0)
MCV: 89.3 fL (ref 80.0–100.0)
Platelets: 240 10*3/uL (ref 150–400)
RBC: 4 MIL/uL (ref 3.87–5.11)
RDW: 14.3 % (ref 11.5–15.5)
WBC: 9.2 10*3/uL (ref 4.0–10.5)
nRBC: 0 % (ref 0.0–0.2)

## 2019-12-19 LAB — I-STAT BETA HCG BLOOD, ED (MC, WL, AP ONLY): I-stat hCG, quantitative: >2000 m[IU]/mL — ABNORMAL HIGH (ref <5)

## 2019-12-19 NOTE — ED Provider Notes (Signed)
MSE was initiated and I personally evaluated the patient and placed orders (if any) at  9:23 PM on December 19, 2019.  The patient appears stable so that the remainder of the MSE may be completed by another provider.  I spoke with him [facility] provider I spoke with the MAU provider who accepts the patient in transfer.  The patient is stable she is having vaginal bleeding over the last 2 days.  Her vital signs have been normal here thus far.   Charlestine Night, PA-C 12/19/19 2126    Arby Barrette, MD 12/24/19 1052

## 2019-12-19 NOTE — MAU Provider Note (Signed)
History     CSN: [id number]  Arrival date and time: 12/19/19 2026   First Provider Initiated Contact with Patient 12/19/19 2230      Chief Complaint  Patient presents with  . Vaginal Bleeding   Molly Thornton is a 34 y.o. [id number] at [redacted]w[redacted]d who receives care at [facility].  She presents today for Vaginal Bleeding.  She states she started having some light pink discharge after sex two days ago.  Patient reports that the bleeding increased today and is now bright red.  Patient denies passing clots and has had no abdominal cramping.  However, she does report an occassional sharp shooting pain since arrival to the MAU.      OB History    Gravida  4   Para  2   Term  2   Preterm      AB  1   Living  2     SAB      TAB  1   Ectopic      Multiple      Live Births  2           Past Medical History:  Diagnosis Date  . Anxiety   . Bipolar disorder ([place])   . Depression   . Hepatitis C    "never tx'd" (05/02/2018)  . Migraine    "daily" (05/02/2018)  . PTSD (post-traumatic stress disorder)   . Sickle cell trait [facility])     Past Surgical History:  Procedure Laterality Date  . NO PAST SURGERIES    . WISDOM TOOTH EXTRACTION      Family History  Problem Relation Age of Onset  . Heart disease Father   . Hypertension Father   . Asthma Brother     Social History   Tobacco Use  . Smoking status: Current Every Day Smoker    Packs/day: 0.50    Years: 17.00    Pack years: 8.50    Types: Cigarettes  . Smokeless tobacco: Never Used  Substance Use Topics  . Alcohol use: Not Currently    Comment: 05/02/2018 "nothing since 2013"  . Drug use: Not Currently    Frequency: 1.0 times per week    Types: Heroin, Marijuana, Oxycodone    Comment: "not currently" for heroin/oxy per pt- marijuana last use 12-18-19    Allergies:  Allergies  Allergen Reactions  . Hydrocodone Hives  . Ivp Dye [Iodinated Diagnostic Agents] Rash    Medications Prior to Admission   Medication Sig Dispense Refill Last Dose  . Prenatal Vit-Fe Fumarate-FA (PRENATAL MULTIVITAMIN) TABS tablet Take 1 tablet by mouth daily at 12 noon.   12/19/2019 at Unknown time  . Sofosbuvir-Velpatasvir (EPCLUSA) 400-100 MG TABS Take 1 tablet by mouth daily. 28 tablet 2   . topiramate (TOPAMAX) 25 MG tablet Take 2 tablets (50 mg total) by mouth at bedtime. 60 tablet 2     Review of Systems  Constitutional: Negative for chills and fever.  Respiratory: Negative for cough and shortness of breath.   Gastrointestinal: Positive for abdominal pain. Negative for nausea and vomiting.  Genitourinary: Positive for dyspareunia (Uncomfortable ) and vaginal bleeding. Negative for difficulty urinating, dysuria and vaginal discharge.  Neurological: Positive for light-headedness (States felt "lethargic and fatigued today") and headaches. Negative for dizziness.   Physical Exam   Blood pressure 100/62, pulse 79, temperature 98.6 F (37 C), temperature source Oral, resp. rate 16, height 5\' 7"  (1.702 m), weight 66.2 kg, last menstrual period 10/17/2019, SpO2 98 %.  Physical Exam  Constitutional: She is oriented to person, place, and time. She appears well-developed and well-nourished. No distress.  HENT:  Head: Normocephalic and atraumatic.  Eyes: Conjunctivae are normal.  Cardiovascular: Normal rate, regular rhythm and normal heart sounds.  Respiratory: Effort normal and breath sounds normal.  GI: Soft. There is no abdominal tenderness.  Genitourinary: Uterus is enlarged (C/w [redacted] wk GA). Cervix exhibits no motion tenderness, no discharge and no friability.    Vaginal discharge (Brownish red) present.     Genitourinary Comments: Speculum Exam: -Normal External Genitalia: Non tender, no apparent discharge at introitus.  -Vaginal Vault: Pink mucosa with good rugae. Small amt dark red brownish discharge removed with faux swab x 1 -Cervix:Pink, no lesions, cysts, or polyps.  Appears closed. No active bleeding  from os -Bimanual Exam:  Closed    Musculoskeletal:        General: Normal range of motion.     Cervical back: Normal range of motion.  Neurological: She is alert and oriented to person, place, and time.  Skin: Skin is warm and dry.  Psychiatric: She has a normal mood and affect. Her behavior is normal.    MAU Course  Procedures Results for orders placed or performed during the hospital encounter of 12/19/19 (from the past 24 hour(s))  Basic metabolic panel     Status: Abnormal   Collection Time: 12/19/19  8:53 PM  Result Value Ref Range   Sodium 134 (L) 135 - 145 mmol/L   Potassium 4.2 3.5 - 5.1 mmol/L   Chloride 102 98 - 111 mmol/L   CO2 22 22 - 32 mmol/L   Glucose, Bld 95 70 - 99 mg/dL   BUN 14 6 - 20 mg/dL   Creatinine, Ser 3.41 0.44 - 1.00 mg/dL   Calcium 9.3 8.9 - 93.7 mg/dL   GFR calc non Af Amer >60 >60 mL/min   GFR calc Af Amer >60 >60 mL/min   Anion gap 10 5 - 15  CBC     Status: Abnormal   Collection Time: 12/19/19  8:53 PM  Result Value Ref Range   WBC 9.2 4.0 - 10.5 K/uL   RBC 4.00 3.87 - 5.11 MIL/uL   Hemoglobin 12.2 12.0 - 15.0 g/dL   HCT 90.2 (L) 40.9 - 73.5 %   MCV 89.3 80.0 - 100.0 fL   MCH 30.5 26.0 - 34.0 pg   MCHC 34.2 30.0 - 36.0 g/dL   RDW 32.9 92.4 - 26.8 %   Platelets 240 150 - 400 K/uL   nRBC 0.0 0.0 - 0.2 %  I-Stat beta hCG blood, ED     Status: Abnormal   Collection Time: 12/19/19  9:00 PM  Result Value Ref Range   I-stat hCG, quantitative >2,000.0 (H) <5 mIU/mL   Comment 3           US OB Transvaginal  Result Date: 12/19/2019 CLINICAL DATA:  Bleeding, pregnant, assess fetal viability EXAM: OBSTETRIC <14 WK [place] AND TRANSVAGINAL OB US TECHNIQUE: Both transabdominal and transvaginal ultrasound examinations were performed for complete evaluation of the gestation as well as the maternal uterus, adnexal regions, and pelvic cul-de-sac. Transvaginal technique was performed to assess early pregnancy. COMPARISON:  11/26/2019 FINDINGS: Intrauterine  gestational sac: Single Yolk sac:  Visualized. Embryo:  Visualized. Cardiac Activity: Not Visualized. CRL:  2.8 mm   5 w   5 d Subchorionic hemorrhage:  None visualized. Maternal uterus/adnexae: Right ovary is not visualized. Uterus and left ovary are unremarkable. IMPRESSION: 1.  No significant change to the appearance of the gestational sac and intrauterine pregnancy since prior study. The lack of cardiac activity or interval growth is compatible with a missed abortion. Electronically Signed   By: Sharlet Salina M.D.   On: 12/19/2019 23:16    MDM Pelvic Exam Labs: UA, CBC, hCG, ABO Ultrasound Assessment and Plan  34 year old, [id number]  SIUP at 9.1 weeks by LMP Vaginal Bleeding  -Reviewed POC with patient. -Exam performed and findings discussed.  -Patient informed that uterine size not c/w dates, but difficult to assess d/t position. -Will add on hCG to labs previously ordered in [facility] -Will send for [place]  Cherre Robins 12/19/2019, 10:31 PM   Reassessment (12:05 AM) Missed AB  -[place] results return without significant change from 3/4 [place]. -Patient informed of results and condolences and support given for loss.  -Patient offered time to process information given and declines. -Reviewed risks and benefits of options for MAB including expectant mgmt, pharmacological mgmt, and surgical mgmt.  -Patient reports desire for surgical mgmt with [facility], but expresses concern with lack of insurance. -Informed that provider unsure of how process would work without insurance.  Discussed possibility of in office procedure. -Patient informed that office and/or scheduler would contact regarding further information.  -Patient further questions her chances of fertility in the future.  Informed that provider unsure and she should discuss with MD after [facility] procedure. -Patient without further questions or concerns. -Message sent to scheduler, J.Battle, requesting more information. -Bleeding Precautions  Given. -Encouraged to call or return to MAU if symptoms worsen or with the onset of new symptoms. -Discharged to home in stable condition.  Cherre Robins MSN, CNM Advanced Practice Provider, [facility]

## 2019-12-19 NOTE — MAU Note (Addendum)
After sex, pink discharge yesterday but then today, dark red to brown discharge.  Occasional sharp pain on left lower abd just started when arrived. No matter how much drink water, feels thirsty and has headache. History of migraines, used to be on Imitrex, no longer has insurance and couldn't afford. Yesterday,  vomited 6 times and once this morning.

## 2019-12-19 NOTE — ED Triage Notes (Signed)
Pt c/o dark brown vaginal bleeding today, pt reports pink spotting yesterday. Denies pain. Pt reports being [redacted] weeks pregnant.

## 2019-12-20 DIAGNOSIS — O021 Missed abortion: Secondary | ICD-10-CM | POA: Diagnosis not present

## 2019-12-20 LAB — URINALYSIS, ROUTINE W REFLEX MICROSCOPIC
Bilirubin Urine: NEGATIVE
Glucose, UA: NEGATIVE mg/dL
Hgb urine dipstick: NEGATIVE
Ketones, ur: NEGATIVE mg/dL
Leukocytes,Ua: NEGATIVE
Nitrite: NEGATIVE
Protein, ur: NEGATIVE mg/dL
Specific Gravity, Urine: 1.011 (ref 1.005–1.030)
pH: 7 (ref 5.0–8.0)

## 2019-12-20 LAB — HCG, QUANTITATIVE, PREGNANCY: hCG, Beta Chain, Quant, S: 14595 m[IU]/mL — ABNORMAL HIGH (ref <5)

## 2019-12-20 NOTE — Discharge Instructions (Signed)
Managing Pregnancy Loss Pregnancy loss can happen any time during a pregnancy. Often the cause is not known. It is rarely because of anything you did. Pregnancy loss in early pregnancy (during the first trimester) is called a miscarriage. This type of pregnancy loss is the most common. Pregnancy loss that happens after 20 weeks of pregnancy is called fetal demise if the baby's heart stops beating before birth. Fetal demise is much less common. Some women experience spontaneous labor shortly after fetal demise resulting in a stillborn birth (stillbirth). Any pregnancy loss can be devastating. You will need to recover both physically and emotionally. Most women are able to get pregnant again after a pregnancy loss and deliver a healthy baby. How to manage emotional recovery  Pregnancy loss is very hard emotionally. You may feel many different emotions while you grieve. You may feel sad and angry. You may also feel guilty. It is normal to have periods of crying. Emotional recovery can take longer than physical recovery. It is different for everyone. Taking these steps can help you in managing this loss:  Remember that it is unlikely you did anything to cause the pregnancy loss.  Share your thoughts and feelings with friends, family, and your partner. Remember that your partner is also recovering emotionally.  Make sure you have a good support system. Do not spend too much time alone.  Meet with a pregnancy loss counselor or join a pregnancy loss support group.  Get enough sleep and eat a healthy diet. Return to regular exercise when you have recovered physically.  Do not use drugs or alcohol to manage your emotions.  Consider seeing a mental health professional to help you recover emotionally.  Ask a friend or loved one to help you decide what to do with any clothing and nursery items you received for your baby. In the case of a stillbirth, many women benefit from taking additional steps in the  grieving process. You may want to:  Hold your baby after the birth.  Name your baby.  Request a birth certificate.  Create a keepsake such as handprints or footprints.  Dress your baby and have a picture taken.  Make funeral arrangements.  Ask for a baptism or blessing. Hospitals have staff members who can help you with all these arrangements. How to recognize emotional stress It is normal to have emotional stress after a pregnancy loss. But emotional stress that lasts a long time or becomes severe requires treatment. Watch out for these signs of severe emotional stress:  Sadness, anger, or guilt that is not going away and is interfering with your normal activities.  Relationship problems that have occurred or gotten worse since the pregnancy loss.  Signs of depression that last longer than 2 weeks. These may include: ? Sadness. ? Anxiety. ? Hopelessness. ? Loss of interest in activities you enjoy. ? Inability to concentrate. ? Trouble sleeping or sleeping too much. ? Loss of appetite or overeating. ? Thoughts of death or of hurting yourself. Follow these instructions at home:  Take over-the-counter and prescription medicines only as told by your health care provider.  Rest at home until your energy level returns. Return to your normal activities as told by your health care provider. Ask your health care provider what activities are safe for you.  When you are ready, meet with your health care provider to discuss steps to take for a future pregnancy.  Keep all follow-up visits as told by your health care provider. This is important.   Where to find support  To help you and your partner with the process of grieving, talk with your health care provider or seek counseling.  Consider meeting with others who have experienced pregnancy loss. Ask your health care provider about support groups and resources. Where to find more information  [place].S[place] Department of Health and Human  Services Office on Women's Health: www.womenshealth.gov  American Pregnancy Association: www.americanpregnancy.org Contact a health care provider if:  You continue to experience grief, sadness, or lack of motivation for everyday activities, and those feelings do not improve over time.  You are struggling to recover emotionally, especially if you are using alcohol or substances to help. Get help right away if:  You have thoughts of hurting yourself or others. If you ever feel like you may hurt yourself or others, or have thoughts about taking your own life, get help right away. You can go to your nearest emergency department or call:  Your local emergency services (911 in the [place]  A suicide crisis helpline, such as the National Suicide Prevention Lifeline at 1-800-273-8255. This is open 24 hours a day. Summary  Any pregnancy loss can be difficult physically and emotionally.  You may experience many different emotions while you grieve. Emotional recovery can last longer than physical recovery.  It is normal to have emotional stress after a pregnancy loss. But emotional stress that lasts a long time or becomes severe requires treatment.  See your health care provider if you are struggling emotionally after a pregnancy loss. This information is not intended to replace advice given to you by your health care provider. Make sure you discuss any questions you have with your health care provider. Document Revised: 12/31/2018 Document Reviewed: 11/21/2017 Elsevier Patient Education  2020 Elsevier Inc.  

## 2019-12-22 ENCOUNTER — Other Ambulatory Visit (HOSPITAL_COMMUNITY): Payer: Self-pay | Admitting: Obstetrics & Gynecology

## 2019-12-22 ENCOUNTER — Other Ambulatory Visit: Payer: Self-pay | Admitting: Obstetrics & Gynecology

## 2019-12-22 ENCOUNTER — Telehealth: Payer: Self-pay | Admitting: Family Medicine

## 2019-12-22 DIAGNOSIS — O021 Missed abortion: Secondary | ICD-10-CM

## 2019-12-22 MED ORDER — MISOPROSTOL 200 MCG PO TABS: ORAL_TABLET | ORAL | 0 refills | Status: DC

## 2019-12-22 NOTE — Telephone Encounter (Signed)
Called pt and discussed her concern. She stated that she was seen @ [facility]U on 3/27 and diagnosed with miscarriage. She received a call today letting her know that her surgery is scheduled for tomorrow.  Pt states she passed "the sac" on 3/28 @ 10 pm and is only having pink spotting. She described the sac as red/purple and with some white tissue inside. She has some lower back pain however denies abdominal cramping or pelvic pain. Pt wants to know if she still needs the surgery. I discussed with Dr. Shawnie Pons and the following plan of care was composed if pt is agreeable:  Cancel surgery for tomorrow and pt to place Cytotec 1000 mcg per vagina today. Based on results, pt may need follow up ultrasound. She should call the office on Thursday with update of her status. If she has very heavy bleeding (2 or more pads per hour for several hours) or extreme pelvic pain, she should return to [facility]U for evaluation. I called pt and discussed the plan of care. She voiced understanding and agreed. Rx for Cytotec e-prescribed.

## 2019-12-22 NOTE — Telephone Encounter (Signed)
Patient called in stating that she needs to speak to someone in regards to her taking the pill verses having surgery due to her having had a miscarriage. Patient stated that she was seen in [facility]U on Saturday and was told that she passed that sac. Patient instructed that a nurse would be giving her a call back as soon as they can. Patient verbalized understanding and message sent to Diane.

## 2019-12-22 NOTE — Addendum Note (Signed)
Addended by: Jill Side on: 12/22/2019 04:21 PM   Modules accepted: Orders

## 2019-12-23 ENCOUNTER — Ambulatory Visit (HOSPITAL_BASED_OUTPATIENT_CLINIC_OR_DEPARTMENT_OTHER)
Admission: RE | Admit: 2019-12-23 | Payer: PRIVATE HEALTH INSURANCE | Source: Ambulatory Visit | Admitting: Obstetrics & Gynecology

## 2019-12-23 ENCOUNTER — Encounter (HOSPITAL_BASED_OUTPATIENT_CLINIC_OR_DEPARTMENT_OTHER): Admission: RE | Payer: Self-pay | Source: Ambulatory Visit

## 2019-12-23 ENCOUNTER — Other Ambulatory Visit (HOSPITAL_COMMUNITY): Payer: PRIVATE HEALTH INSURANCE

## 2019-12-23 SURGERY — DILATION AND EVACUATION, UTERUS
Anesthesia: Choice

## 2019-12-24 ENCOUNTER — Telehealth: Payer: Self-pay | Admitting: Family Medicine

## 2019-12-24 ENCOUNTER — Encounter: Payer: Self-pay | Admitting: *Deleted

## 2019-12-24 DIAGNOSIS — O021 Missed abortion: Secondary | ICD-10-CM

## 2019-12-24 MED ORDER — MISOPROSTOL 200 MCG PO TABS: ORAL_TABLET | ORAL | 0 refills | Status: DC

## 2019-12-24 NOTE — Telephone Encounter (Addendum)
Called pt and she reports that she administered the Cytotec per vagina on 3/30 @ bedtime. Since that time she has had only pink and brown spotting but no bright red bleeding. She also has not had any pain or cramping. I consulted with Dr. Shawnie Pons and she advised: repeat Cytotec 1000 mcg per vagina  @ bedtime today, schedule [place] next week to assess uterine contents. Pt was informed of plan of care and that I will send a MyChart message regarding her [place] appointment once it has been scheduled. She agreed and voiced understanding of all information and instructions given.

## 2019-12-24 NOTE — Telephone Encounter (Signed)
Patient is requesting to speak to a nurse.

## 2019-12-28 ENCOUNTER — Ambulatory Visit (HOSPITAL_COMMUNITY): Admission: RE | Admit: 2019-12-28 | Payer: PRIVATE HEALTH INSURANCE | Source: Ambulatory Visit

## 2019-12-31 ENCOUNTER — Encounter: Payer: Self-pay | Admitting: *Deleted

## 2019-12-31 NOTE — Progress Notes (Signed)
Patient was scheduled for Ultrasound on 12/28/19.  Patient no showed ultrasound.  Received call from [facility] today stating the study has been approved.  The preauth # [id number] - this information has been placed in the referral.  Phoned patient to ask if she would like this study rescheduled.  Informed patient insurance had approved the service.  Explained to patient per request from [facility] that she needs to contact [place] at [phone].  Insurance company states the patient will no longer have coverage other than the approved ultrasound since she is now living in [place].  Patient states understanding.  Patient states she will contact them immediately.  I informed patient I will call to reschedule her ultrasound.  Patient requests scan be scheduled on a Tuesday or Wednesday if possible as these are her days off.  I encouraged patient to look at her My Chart account for the rescheduled date and time of the ultrasound.  Patient states understanding.  Phoned the Ultrasound Scheduling department at [phone] and spoke with Sheralyn Boatman.  Appointment rescheduled for 01/05/20 at 3 pm.  Patient will need to arrive at 2:45 pm.  Will send My Chart message to patient.

## 2020-01-05 ENCOUNTER — Ambulatory Visit (HOSPITAL_COMMUNITY)
Admission: RE | Admit: 2020-01-05 | Discharge: 2020-01-05 | Disposition: A | Discharge status: Home / Self Care | Payer: PRIVATE HEALTH INSURANCE | Source: Ambulatory Visit | Attending: Family Medicine | Admitting: Family Medicine

## 2020-01-05 ENCOUNTER — Other Ambulatory Visit: Payer: Self-pay

## 2020-01-05 DIAGNOSIS — O021 Missed abortion: Secondary | ICD-10-CM

## 2020-01-12 ENCOUNTER — Telehealth: Payer: Self-pay | Admitting: Family Medicine

## 2020-01-12 NOTE — Telephone Encounter (Signed)
Patient called to get Ultra Sound results.

## 2020-01-13 NOTE — Telephone Encounter (Signed)
I spoke w/pt and provided [place] results from 4/13. Pt confirmed that she did have bleeding after second dose of Cytotec on 4/1. Currently she is not having any pain or bleeding. Pt does not desire birth control and would like to be pregnant. I informed that we advise she use condoms until she has one normal period and then she may resume attempts to become pregnant. Pt stated that she has had unprotected sex several times since the miscarriage however will use condoms going forward until she gets a period.

## 2020-02-24 ENCOUNTER — Ambulatory Visit: Payer: PRIVATE HEALTH INSURANCE | Admitting: Obstetrics and Gynecology

## 2020-03-09 ENCOUNTER — Encounter: Payer: Self-pay | Admitting: Podiatry

## 2020-03-09 ENCOUNTER — Ambulatory Visit: Payer: Medicaid Other | Admitting: Podiatry

## 2020-03-09 ENCOUNTER — Ambulatory Visit (INDEPENDENT_AMBULATORY_CARE_PROVIDER_SITE_OTHER): Payer: Medicaid Other

## 2020-03-09 ENCOUNTER — Other Ambulatory Visit: Payer: Self-pay

## 2020-03-09 ENCOUNTER — Other Ambulatory Visit: Payer: Self-pay | Admitting: Podiatry

## 2020-03-09 DIAGNOSIS — M7751 Other enthesopathy of right foot: Secondary | ICD-10-CM

## 2020-03-09 DIAGNOSIS — M79671 Pain in right foot: Secondary | ICD-10-CM

## 2020-03-09 DIAGNOSIS — L84 Corns and callosities: Secondary | ICD-10-CM

## 2020-03-09 DIAGNOSIS — M779 Enthesopathy, unspecified: Secondary | ICD-10-CM

## 2020-03-10 NOTE — Progress Notes (Signed)
Subjective:   Patient ID: Molly Thornton, female   DOB: 34 y.o.   MRN: [id number]   HPI Patient presents stating she broke her foot 13 years ago and she still will get occasional pain associated with it but she has severe callus formation on both feet and states it does get very tender she tries to trim them but they are hard to do.  She does have a very deep odor of either tobacco or marijuana and states that she does smoke.  Patient is not currently active and states to assistant that she is not currently pregnant   Review of Systems  All other systems reviewed and are negative.       Objective:  Physical Exam Vitals and nursing note reviewed.  Constitutional:      Appearance: She is well-developed.  Pulmonary:     Effort: Pulmonary effort is normal.  Musculoskeletal:        General: Normal range of motion.  Skin:    General: Skin is warm.  Neurological:     Mental Status: She is alert.     Neurovascular status was found to be intact muscle strength was found to be adequate.  I did not note any edema in either foot and upon palpation I was not able to elicit symptoms in the midfoot or forefoot.  She is not a good historian cannot make a good description of pain but states that she does swell at times and she is moderately belligerent and that we need to fix her foot.  I did note that there is severe callus formation subfirst fifth metatarsal bilateral and between the hallux and second digit right foot with keratotic tissue formation that most likely is hereditary in nature and related to her skin structure.  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  I did  review with her that the callus formation is something she will have to live with and she should use padded shoes today using sterile debridement I did debride these as best I could.  She was quite belligerent that we need to fix her foot and I explained that I did not see anything that could be fixable and I do not understand the type of symptoms she is experiencing as she is not a good historian.  Again she was unsatisfied with this return and stepmother to herself that we are a complete waste of time along with other terms I would prefer not to use.  I did the best I could to give her relief and I am hoping by deep debridement this will give her some relief of symptoms along with padded shoes and I also provide cushioning for the hallux and second digit on the right foot.  I did advise if she wants to pursue this further she may want to consider [facility] for second opinion but I did not see anything that I could see on x-ray that is fixable or clinically that I can provide benefit to her  X-ray was negative for signs of midfoot arthritis with the possibility that there had been old fractures but I do not see right now where these are  nonhealing or that there is any kind of surgical resolution for the discomfort she is describing which again I was unable to ascertain to a good degree

## 2020-04-25 ENCOUNTER — Inpatient Hospital Stay (HOSPITAL_COMMUNITY)
Admission: EM | Admit: 2020-04-25 | Discharge: 2020-04-26 | Disposition: A | Discharge status: Home / Self Care | Payer: Medicaid Other | Attending: Obstetrics & Gynecology | Admitting: Obstetrics & Gynecology

## 2020-04-25 ENCOUNTER — Encounter (HOSPITAL_COMMUNITY): Payer: Self-pay | Admitting: Emergency Medicine

## 2020-04-25 ENCOUNTER — Other Ambulatory Visit: Payer: Self-pay

## 2020-04-25 DIAGNOSIS — O26891 Other specified pregnancy related conditions, first trimester: Secondary | ICD-10-CM | POA: Insufficient documentation

## 2020-04-25 DIAGNOSIS — O99611 Diseases of the digestive system complicating pregnancy, first trimester: Secondary | ICD-10-CM

## 2020-04-25 DIAGNOSIS — D573 Sickle-cell trait: Secondary | ICD-10-CM | POA: Insufficient documentation

## 2020-04-25 DIAGNOSIS — K5900 Constipation, unspecified: Secondary | ICD-10-CM

## 2020-04-25 DIAGNOSIS — O99341 Other mental disorders complicating pregnancy, first trimester: Secondary | ICD-10-CM | POA: Insufficient documentation

## 2020-04-25 DIAGNOSIS — Z79899 Other long term (current) drug therapy: Secondary | ICD-10-CM | POA: Diagnosis not present

## 2020-04-25 DIAGNOSIS — O99323 Drug use complicating pregnancy, third trimester: Secondary | ICD-10-CM | POA: Diagnosis not present

## 2020-04-25 DIAGNOSIS — Z885 Allergy status to narcotic agent status: Secondary | ICD-10-CM | POA: Diagnosis not present

## 2020-04-25 DIAGNOSIS — F319 Bipolar disorder, unspecified: Secondary | ICD-10-CM | POA: Insufficient documentation

## 2020-04-25 DIAGNOSIS — Z3A10 10 weeks gestation of pregnancy: Secondary | ICD-10-CM | POA: Diagnosis not present

## 2020-04-25 DIAGNOSIS — Z91041 Radiographic dye allergy status: Secondary | ICD-10-CM | POA: Diagnosis not present

## 2020-04-25 DIAGNOSIS — O99331 Smoking (tobacco) complicating pregnancy, first trimester: Secondary | ICD-10-CM | POA: Insufficient documentation

## 2020-04-25 DIAGNOSIS — O219 Vomiting of pregnancy, unspecified: Secondary | ICD-10-CM

## 2020-04-25 DIAGNOSIS — O99333 Smoking (tobacco) complicating pregnancy, third trimester: Secondary | ICD-10-CM | POA: Diagnosis not present

## 2020-04-25 DIAGNOSIS — F1910 Other psychoactive substance abuse, uncomplicated: Secondary | ICD-10-CM | POA: Diagnosis not present

## 2020-04-25 DIAGNOSIS — M545 Low back pain: Secondary | ICD-10-CM | POA: Diagnosis not present

## 2020-04-25 NOTE — ED Triage Notes (Signed)
Patient reports low back pain with emesis this week , no diarrhea , denies fever or chills . She is [redacted] weeks pregnant , no vaginal bleeding or abdominal contractions .

## 2020-04-26 ENCOUNTER — Encounter (HOSPITAL_COMMUNITY): Payer: Self-pay | Admitting: Obstetrics & Gynecology

## 2020-04-26 DIAGNOSIS — O219 Vomiting of pregnancy, unspecified: Secondary | ICD-10-CM

## 2020-04-26 DIAGNOSIS — F17200 Nicotine dependence, unspecified, uncomplicated: Secondary | ICD-10-CM

## 2020-04-26 DIAGNOSIS — O99333 Smoking (tobacco) complicating pregnancy, third trimester: Secondary | ICD-10-CM

## 2020-04-26 DIAGNOSIS — Z3A14 14 weeks gestation of pregnancy: Secondary | ICD-10-CM

## 2020-04-26 DIAGNOSIS — Z79891 Long term (current) use of opiate analgesic: Secondary | ICD-10-CM

## 2020-04-26 DIAGNOSIS — O99323 Drug use complicating pregnancy, third trimester: Secondary | ICD-10-CM

## 2020-04-26 DIAGNOSIS — F1910 Other psychoactive substance abuse, uncomplicated: Secondary | ICD-10-CM

## 2020-04-26 LAB — COMPREHENSIVE METABOLIC PANEL
ALT: 14 U/L (ref 0–44)
AST: 17 U/L (ref 15–41)
Albumin: 3.6 g/dL (ref 3.5–5.0)
Alkaline Phosphatase: 57 U/L (ref 38–126)
Anion gap: 10 (ref 5–15)
BUN: 9 mg/dL (ref 6–20)
CO2: 22 mmol/L (ref 22–32)
Calcium: 9.4 mg/dL (ref 8.9–10.3)
Chloride: 102 mmol/L (ref 98–111)
Creatinine, Ser: 0.6 mg/dL (ref 0.44–1.00)
GFR calc Af Amer: >60 mL/min (ref >60)
GFR calc non Af Amer: >60 mL/min (ref >60)
Glucose, Bld: 78 mg/dL (ref 70–99)
Potassium: 3.9 mmol/L (ref 3.5–5.1)
Sodium: 134 mmol/L — ABNORMAL LOW (ref 135–145)
Total Bilirubin: 0.2 mg/dL — ABNORMAL LOW (ref 0.3–1.2)
Total Protein: 7 g/dL (ref 6.5–8.1)

## 2020-04-26 LAB — CBC WITH DIFFERENTIAL/PLATELET
Abs Immature Granulocytes: 0.04 10*3/uL (ref 0.00–0.07)
Basophils Absolute: 0.1 10*3/uL (ref 0.0–0.1)
Basophils Relative: 1 %
Eosinophils Absolute: 0.3 10*3/uL (ref 0.0–0.5)
Eosinophils Relative: 2 %
HCT: 35.1 % — ABNORMAL LOW (ref 36.0–46.0)
Hemoglobin: 12.2 g/dL (ref 12.0–15.0)
Immature Granulocytes: 0 %
Lymphocytes Relative: 28 %
Lymphs Abs: 3.3 10*3/uL (ref 0.7–4.0)
MCH: 30 pg (ref 26.0–34.0)
MCHC: 34.8 g/dL (ref 30.0–36.0)
MCV: 86.2 fL (ref 80.0–100.0)
Monocytes Absolute: 0.9 10*3/uL (ref 0.1–1.0)
Monocytes Relative: 8 %
Neutro Abs: 7.3 10*3/uL (ref 1.7–7.7)
Neutrophils Relative %: 61 %
Platelets: 243 10*3/uL (ref 150–400)
RBC: 4.07 MIL/uL (ref 3.87–5.11)
RDW: 13.4 % (ref 11.5–15.5)
WBC: 11.9 10*3/uL — ABNORMAL HIGH (ref 4.0–10.5)
nRBC: 0 % (ref 0.0–0.2)

## 2020-04-26 LAB — URINALYSIS, ROUTINE W REFLEX MICROSCOPIC
Bilirubin Urine: NEGATIVE
Glucose, UA: NEGATIVE mg/dL
Hgb urine dipstick: NEGATIVE
Ketones, ur: NEGATIVE mg/dL
Leukocytes,Ua: NEGATIVE
Nitrite: NEGATIVE
Protein, ur: NEGATIVE mg/dL
Specific Gravity, Urine: 1.016 (ref 1.005–1.030)
pH: 6 (ref 5.0–8.0)

## 2020-04-26 LAB — I-STAT BETA HCG BLOOD, ED (NOT ORDERABLE): I-stat hCG, quantitative: >2000 m[IU]/mL — ABNORMAL HIGH (ref <5)

## 2020-04-26 MED ORDER — PROMETHAZINE HCL 25 MG PO TABS: 25.0000 mg | ORAL_TABLET | Freq: Four times a day (QID) | ORAL | 0 refills | Status: DC | PRN

## 2020-04-26 MED ORDER — PROMETHAZINE HCL 25 MG/ML IJ SOLN
25.0000 mg | Freq: Once | INTRAMUSCULAR | Status: AC
  Administered 2020-04-26: 25 mg via INTRAMUSCULAR
  Filled 2020-04-26: qty 1

## 2020-04-26 NOTE — ED Notes (Signed)
BHcG >2000. ISTAT not crossing over.

## 2020-04-26 NOTE — MAU Note (Addendum)
.  Molly Thornton is a 34 y.o. at approximately [redacted] weeks pregnant in MAU via ED transfer reporting: lower back pain and nausea/vomiting that has been occurring for weeks. Pt reports she last ate around 1600 yesterday afternoon but was unable to keep anything down and has vomited around 15 times in the past 24 hours. Pt reports dizziness and fatigue. Pt reports she has not seen anyone because she has been waiting on her insurance card but could not wait any longer. No VB or abnormal discharge. Pt reports constipation as well.

## 2020-04-26 NOTE — MAU Provider Note (Signed)
Chief Complaint: Emesis ([redacted] weeks pregnant), Back Pain, and Nausea   First Provider Initiated Contact with Patient 04/26/20 0218     SUBJECTIVE HPI: Molly Thornton is a 34 y.o. [id number] at 103w3d who presents to Maternity Admissions reporting nausea/vomiting, and back pain. Symptoms started a few weeks ago. Reports vomiting 15+ times per day. Is only able to tolerate soup at times. Does not have antiemetic at home so hasn't treated symptoms.  Also reports low back pain that is intermittent & generalized abdominal soreness from vomiting. Denies fever/chills, dysuria, vaginal bleeding, or vaginal discharge.   Location: back Quality: aching Severity: 6/10 on pain scale Duration: weeks Timing: intermittent Modifying factors: vomiting makes symptoms worse. Hasn't treated Associated signs and symptoms: n/v  Past Medical History:  Diagnosis Date  . Anxiety   . Bipolar disorder (HCC)   . Depression   . Hepatitis C    "never tx'd" (05/02/2018)  . Migraine    "daily" (05/02/2018)  . PTSD (post-traumatic stress disorder)   . Sickle cell trait (HCC)    OB History  Gravida Para Term Preterm AB Living  5 2 2   2 2   SAB TAB Ectopic Multiple Live Births  1 1     2     # Outcome Date GA Lbr Len/2nd Weight Sex Delivery Anes PTL Lv  5 Current           4 SAB 11/2019          3 TAB 04/02/09          2 Term 01/25/09    M Vag-Spont   LIV  1 Term 09/04/07    F Vag-Spont   LIV   Past Surgical History:  Procedure Laterality Date  . WISDOM TOOTH EXTRACTION     Social History   Socioeconomic History  . Marital status: Single    Spouse name: Not on file  . Number of children: Not on file  . Years of education: Not on file  . Highest education level: Not on file  Occupational History  . Not on file  Tobacco Use  . Smoking status: Current Every Day Smoker    Packs/day: 0.50    Years: 17.00    Pack years: 8.50    Types: Cigarettes  . Smokeless tobacco: Never Used  Vaping Use  . Vaping  Use: Never used  Substance and Sexual Activity  . Alcohol use: Not Currently    Comment: 05/02/2018 "nothing since 2013"  . Drug use: Yes    Frequency: 1.0 times per week    Types: Marijuana, Oxycodone, Heroin    Comment: "not currently" for heroin/oxy per pt- marijuana last use 04/25/20  . Sexual activity: Yes  Other Topics Concern  . Not on file  Social History Narrative  . Not on file   Social Determinants of Health   Financial Resource Strain:   . Difficulty of Paying Living Expenses:   Food Insecurity:   . Worried About 2014 in the Last Year:   . 06/25/20 in the Last Year:   Transportation Needs:   . Programme researcher, broadcasting/film/video (Medical):   Barista Lack of Transportation (Non-Medical):   Physical Activity:   . Days of Exercise per Week:   . Minutes of Exercise per Session:   Stress:   . Feeling of Stress :   Social Connections:   . Frequency of Communication with Friends and Family:   . Frequency of Social Gatherings with Friends and  Family:   . Attends Religious Services:   . Active Member of Clubs or Organizations:   . Attends Banker Meetings:   Marland Kitchen Marital Status:   Intimate Partner Violence:   . Fear of Current or Ex-Partner:   . Emotionally Abused:   Marland Kitchen Physically Abused:   . Sexually Abused:    Family History  Problem Relation Age of Onset  . Heart disease Father   . Hypertension Father   . Asthma Brother   . Diabetes Mother   . Pancreatitis Mother   . Coronary artery disease Mother   . Neuropathy Mother    No current facility-administered medications on file prior to encounter.   Current Outpatient Medications on File Prior to Encounter  Medication Sig Dispense Refill  . Prenatal Vit-Fe Fumarate-FA (PRENATAL MULTIVITAMIN) TABS tablet Take 1 tablet by mouth daily at 12 noon.    . Sofosbuvir-Velpatasvir (EPCLUSA) 400-100 MG TABS Take 1 tablet by mouth daily. 28 tablet 2  . topiramate (TOPAMAX) 25 MG tablet Take 2 tablets (50 mg  total) by mouth at bedtime. 60 tablet 2   Allergies  Allergen Reactions  . Hydrocodone Hives  . Ivp Dye [Iodinated Diagnostic Agents] Rash    I have reviewed patient's Past Medical Hx, Surgical Hx, Family Hx, Social Hx, medications and allergies.   Review of Systems  Constitutional: Negative.   Gastrointestinal: Positive for nausea and vomiting. Negative for abdominal pain.  Genitourinary: Negative.   Musculoskeletal: Positive for back pain.    OBJECTIVE Patient Vitals for the past 24 hrs:  BP Temp Temp src Pulse Resp SpO2 Height Weight  04/26/20 0217 113/78 -- -- 71 -- 100 % -- --  04/25/20 2333 138/77 98.4 F (36.9 C) Oral 82 16 99 % -- --  04/25/20 2332 -- -- -- -- -- -- 5\' 7"  (1.702 m) 75 kg   Constitutional: Well-developed, well-nourished female in no acute distress.  Cardiovascular: normal rate & rhythm, no murmur Respiratory: normal rate and effort. Lung sounds clear throughout GI: Abd soft, non-tender, Pos BS x 4. No guarding or rebound tenderness MS: Extremities nontender, no edema, normal ROM Neurologic: Alert and oriented x 4.     LAB RESULTS Results for orders placed or performed during the hospital encounter of 04/25/20 (from the past 24 hour(s))  CBC with Differential     Status: Abnormal   Collection Time: 04/25/20 11:56 PM  Result Value Ref Range   WBC 11.9 (H) 4.0 - 10.5 K/uL   RBC 4.07 3.87 - 5.11 MIL/uL   Hemoglobin 12.2 12.0 - 15.0 g/dL   HCT 06/25/20 (L) 36 - 46 %   MCV 86.2 80.0 - 100.0 fL   MCH 30.0 26.0 - 34.0 pg   MCHC 34.8 30.0 - 36.0 g/dL   RDW 67.6 72.0 - 94.7 %   Platelets 243 150 - 400 K/uL   nRBC 0.0 0.0 - 0.2 %   Neutrophils Relative % 61 %   Neutro Abs 7.3 1.7 - 7.7 K/uL   Lymphocytes Relative 28 %   Lymphs Abs 3.3 0.7 - 4.0 K/uL   Monocytes Relative 8 %   Monocytes Absolute 0.9 0 - 1 K/uL   Eosinophils Relative 2 %   Eosinophils Absolute 0.3 0 - 0 K/uL   Basophils Relative 1 %   Basophils Absolute 0.1 0 - 0 K/uL   Immature  Granulocytes 0 %   Abs Immature Granulocytes 0.04 0.00 - 0.07 K/uL  Comprehensive metabolic panel  Status: Abnormal   Collection Time: 04/25/20 11:56 PM  Result Value Ref Range   Sodium 134 (L) 135 - 145 mmol/L   Potassium 3.9 3.5 - 5.1 mmol/L   Chloride 102 98 - 111 mmol/L   CO2 22 22 - 32 mmol/L   Glucose, Bld 78 70 - 99 mg/dL   BUN 9 6 - 20 mg/dL   Creatinine, Ser 5.80 0.44 - 1.00 mg/dL   Calcium 9.4 8.9 - 99.8 mg/dL   Total Protein 7.0 6.5 - 8.1 g/dL   Albumin 3.6 3.5 - 5.0 g/dL   AST 17 15 - 41 U/L   ALT 14 0 - 44 U/L   Alkaline Phosphatase 57 38 - 126 U/L   Total Bilirubin 0.2 (L) 0.3 - 1.2 mg/dL   GFR calc non Af Amer >60 >60 mL/min   GFR calc Af Amer >60 >60 mL/min   Anion gap 10 5 - 15  Urinalysis, Routine w reflex microscopic     Status: Abnormal   Collection Time: 04/26/20 12:00 AM  Result Value Ref Range   Color, Urine YELLOW YELLOW   APPearance HAZY (A) CLEAR   Specific Gravity, Urine 1.016 1.005 - 1.030   pH 6.0 5.0 - 8.0   Glucose, UA NEGATIVE NEGATIVE mg/dL   Hgb urine dipstick NEGATIVE NEGATIVE   Bilirubin Urine NEGATIVE NEGATIVE   Ketones, ur NEGATIVE NEGATIVE mg/dL   Protein, ur NEGATIVE NEGATIVE mg/dL   Nitrite NEGATIVE NEGATIVE   Leukocytes,Ua NEGATIVE NEGATIVE    IMAGING No results found.  MAU COURSE Orders Placed This Encounter  Procedures  . CBC with Differential  . Comprehensive metabolic panel  . Urinalysis, Routine w reflex microscopic  . I-Stat Beta hCG blood, ED ([facility], [facility], [facility] only)  . Discharge patient   Meds ordered this encounter  Medications  . promethazine (PHENERGAN) injection 25 mg  . promethazine (PHENERGAN) 25 MG tablet    Sig: Take 1 tablet (25 mg total) by mouth every 6 (six) hours as needed for nausea or vomiting.    Dispense:  60 tablet    Refill:  0    Order Specific Question:   Supervising Provider    Answer:   Adam Phenix [[id number]]    MDM FHT present via doppler No abdominal pain or vaginal bleeding  No  signs of dehydration or infection per urine. Patient not actively vomiting in MAU. Given phenergan 25 mg IM for symptoms & plan to discharge home with rx phenergan Symptoms improved after IM phenergan  Pt also reports issues with constipation that she hasn't treated. Discussed tx & given written education  ASSESSMENT 1. Nausea and vomiting in pregnancy   2. Constipation during pregnancy in first trimester   3. [redacted] weeks gestation of pregnancy     PLAN Discharge home in stable condition. Rx phenergan Discussed reasons to return to MAU Start prenatal care, given list   Allergies as of 04/26/2020      Reactions   Hydrocodone Hives   Ivp Dye [iodinated Diagnostic Agents] Rash      Medication List    STOP taking these medications   Sofosbuvir-Velpatasvir 400-100 MG Tabs Commonly known as: Epclusa   topiramate 25 MG tablet Commonly known as: TOPAMAX     TAKE these medications   prenatal multivitamin Tabs tablet Take 1 tablet by mouth daily at 12 noon.   promethazine 25 MG tablet Commonly known as: PHENERGAN Take 1 tablet (25 mg total) by mouth every 6 (six) hours as needed  for nausea or vomiting.        Judeth HornLawrence, Lillymae Duet, NP 04/26/2020  3:19 AM

## 2020-04-26 NOTE — Discharge Instructions (Signed)
[facility] [employer] for [employer] at [facility]  [address], [place], [place] [address]  [phone]  Center for [facility] at [facility]  [address], [place], [place] [address]  [phone]  Center for [facility] at [facility] [address] [place], [place] [address]  [phone]  Center for [facility] at [facility]  [address] [place], [place] [address]  [phone]  Center for  [facility] at [facility]  [address]First floor), [place], [place] [address]  [phone]  Center for [facility] [address], [place], [place] [address] [phone]  Center for [facility] at [facility]  [address] [place], [place], [place] [address]  [phone]  [facility]  [address], [place], [place] [address]  [phone]  [facility]  [address] [place], [place], [place] [address]  [phone]  [facility]  [address] [place], [place] [address]  [phone]  [facility]  [address] [place], [place] [address]  [phone]  [facility]  [address], [place], [place] [address]  [phone]  [facility]   [address] [place], [place] [address]  [phone]  [facility]  [address], [place], [place] [address]   [phone]  [facility]  [address] [place], [place] [address]  [phone]          Constipation, Adult Constipation is when a person has fewer bowel movements in a week than normal, has difficulty having a bowel movement, or has stools that are dry, hard, or larger than normal. Constipation may be caused by an underlying condition. It may become worse with age if a person takes certain medicines and does not take in enough fluids. Follow these instructions at home: Eating and drinking   Eat  foods that have a lot of fiber, such as fresh fruits and vegetables, whole grains, and beans.  Limit foods that are high in fat, low in fiber, or overly processed, such as french fries, hamburgers, cookies, candies, and soda.  Drink enough fluid to keep your urine clear or pale yellow. General instructions  Exercise regularly or as told by your health care provider.  Go to the restroom when you have the urge to go. Do not hold it in.  Take over-the-counter and prescription medicines only as told by your health care provider. These include any fiber supplements.  Practice pelvic floor retraining exercises, such as deep breathing while relaxing the lower abdomen and pelvic floor relaxation during bowel movements.  Watch your condition for any changes.  Keep all follow-up visits as told by your health care provider. This is important. Contact a health care provider if:  You have pain that gets worse.  You have a fever.  You do not have a bowel movement after 4 days.  You vomit.  You are not hungry.  You lose weight.  You are bleeding from the anus.  You have thin, pencil-like stools. Get help right away if:  You have a fever and your symptoms suddenly get worse.  You leak stool or have blood in your stool.  Your abdomen is bloated.  You have severe pain in your abdomen.  You feel dizzy or you faint. This information is not intended to replace advice given to you by your  health care provider. Make sure you discuss any questions you have with your health care provider. Document Revised: 08/23/2017 Document Reviewed: 02/29/2016 Elsevier Patient Education  2020 [employer].    Safe Medications in Pregnancy   Acne: Benzoyl Peroxide Salicylic Acid  Backache/Headache: Tylenol: 2 regular strength every 4 hours OR              2 Extra strength every 6 hours  Colds/Coughs/Allergies: Benadryl (alcohol free) 25 mg every 6 hours as needed Breath right  strips Claritin Cepacol throat lozenges Chloraseptic throat spray Cold-Eeze- up to three times per day Cough drops, alcohol free Flonase (by prescription only) Guaifenesin Mucinex Robitussin DM (plain only, alcohol free) Saline nasal spray/drops Sudafed (pseudoephedrine) & Actifed ** use only after [redacted] weeks gestation and if you do not have high blood pressure Tylenol Vicks Vaporub Zinc lozenges Zyrtec   Constipation: Colace Ducolax suppositories Fleet enema Glycerin suppositories Metamucil Milk of magnesia Miralax Senokot Smooth move tea  Diarrhea: Kaopectate Imodium A-D  *NO pepto Bismol  Hemorrhoids: Anusol Anusol HC Preparation H Tucks  Indigestion: Tums Maalox Mylanta Zantac  Pepcid  Insomnia: Benadryl (alcohol free) 25mg  every 6 hours as needed Tylenol PM Unisom, no Gelcaps  Leg Cramps: Tums MagGel  Nausea/Vomiting:  Bonine Dramamine Emetrol Ginger extract Sea bands Meclizine  Nausea medication to take during pregnancy:  Unisom (doxylamine succinate 25 mg tablets) Take one tablet daily at bedtime. If symptoms are not adequately controlled, the dose can be increased to a maximum recommended dose of two tablets daily (1/2 tablet in the morning, 1/2 tablet mid-afternoon and one at bedtime). Vitamin B6 100mg  tablets. Take one tablet twice a day (up to 200 mg per day).  Skin Rashes: Aveeno products Benadryl cream or 25mg  every 6 hours as needed Calamine Lotion 1% cortisone cream  Yeast infection: Gyne-lotrimin 7 Monistat 7  Gum/tooth pain: Anbesol  **If taking multiple medications, please check labels to avoid duplicating the same active ingredients **take medication as directed on the label ** Do not exceed 4000 mg of tylenol in 24 hours **Do not take medications that contain aspirin or ibuprofen

## 2020-04-26 NOTE — ED Provider Notes (Signed)
[facility] EMERGENCY DEPARTMENT Provider Note   CSN: [id number] Arrival date & time: 04/25/20  2203     History Chief Complaint  Patient presents with  . Emesis    [redacted] weeks pregnant  . Back Pain    Molly Thornton is a 34 y.o. female.  Patient is a G5P2A2 (1 ectopic, 1 miscarriage) who is [redacted] weeks pregnant, who presents to the ED with a chief complaint of vomiting since 7/28.  She states that she is having about 15 episodes of vomiting per day.  She denies any fevers.  She states that she has had some cramping in her back, but denies abdominal cramps.  Denies vaginal bleeding.  The history is provided by the patient. No language interpreter was used.       Past Medical History:  Diagnosis Date  . Anxiety   . Bipolar disorder (HCC)   . Depression   . Hepatitis C    "never tx'd" (05/02/2018)  . Migraine    "daily" (05/02/2018)  . PTSD (post-traumatic stress disorder)   . Sickle cell trait [facility])     Patient Active Problem List   Diagnosis Date Noted  . Cocaine abuse (HCC) 06/19/2018  . Opiate abuse, continuous (HCC) 06/19/2018  . Cannabis abuse 06/19/2018  . Benzodiazepine abuse (HCC) 06/19/2018  . Substance induced mood disorder (HCC) 06/19/2018  . Chronic hepatitis C without hepatic coma (HCC) 05/14/2018  . Nausea & vomiting 05/01/2018  . Acute nonintractable headache   . Pyelonephritis     Past Surgical History:  Procedure Laterality Date  . NO PAST SURGERIES    . WISDOM TOOTH EXTRACTION       OB History    Gravida  4   Para  2   Term  2   Preterm      AB  1   Living  2     SAB      TAB  1   Ectopic      Multiple      Live Births  2           Family History  Problem Relation Age of Onset  . Heart disease Father   . Hypertension Father   . Asthma Brother     Social History   Tobacco Use  . Smoking status: Current Every Day Smoker    Packs/day: 0.50    Years: 17.00    Pack years: 8.50    Types:  Cigarettes  . Smokeless tobacco: Never Used  Vaping Use  . Vaping Use: Never used  Substance Use Topics  . Alcohol use: Not Currently    Comment: 05/02/2018 "nothing since 2013"  . Drug use: Not Currently    Frequency: 1.0 times per week    Types: Heroin, Marijuana, Oxycodone    Comment: "not currently" for heroin/oxy per pt- marijuana last use 12-18-19    Home Medications Prior to Admission medications   Medication Sig Start Date End Date Taking? Authorizing Provider  misoprostol (CYTOTEC) 200 MCG tablet Place 1000 mcg in vagina once 12/24/19   Reva Bores, MD  Prenatal Vit-Fe Fumarate-FA (PRENATAL MULTIVITAMIN) TABS tablet Take 1 tablet by mouth daily at 12 noon.    [provider]  Sofosbuvir-Velpatasvir (EPCLUSA) 400-100 MG TABS Take 1 tablet by mouth daily. 05/14/18   Gardiner Barefoot, MD  topiramate (TOPAMAX) 25 MG tablet Take 2 tablets (50 mg total) by mouth at bedtime. 06/05/18   Loletta Specter, PA-C  Allergies    Hydrocodone and Ivp dye [iodinated diagnostic agents]  Review of Systems   Review of Systems  All other systems reviewed and are negative.   Physical Exam Updated Vital Signs BP 138/77 (BP Location: Left Arm)   Pulse 82   Temp 98.4 F (36.9 C) (Oral)   Resp 16   Ht 5\' 7"  (1.702 m)   Wt 75 kg   LMP 10/17/2019 Comment: had unusual bleeding  twice in Jan  SpO2 99%   BMI 25.90 kg/m   Physical Exam Vitals and nursing note reviewed.  Constitutional:      General: She is not in acute distress.    Appearance: She is well-developed.  HENT:     Head: Normocephalic and atraumatic.  Eyes:     Conjunctiva/sclera: Conjunctivae normal.  Cardiovascular:     Rate and Rhythm: Normal rate.     Heart sounds: No murmur heard.   Pulmonary:     Effort: Pulmonary effort is normal. No respiratory distress.  Abdominal:     General: There is no distension.     Tenderness: There is no abdominal tenderness.  Musculoskeletal:     Cervical back: Neck  supple.     Comments: Moves all extremities  Skin:    General: Skin is warm and dry.  Neurological:     Mental Status: She is alert and oriented to person, place, and time.  Psychiatric:        Mood and Affect: Mood normal.        Behavior: Behavior normal.     ED Results / Procedures / Treatments   Labs (all labs ordered are listed, but only abnormal results are displayed) Labs Reviewed  CBC WITH DIFFERENTIAL/PLATELET - Abnormal; Notable for the following components:      Result Value   WBC 11.9 (*)    HCT 35.1 (*)    All other components within normal limits  COMPREHENSIVE METABOLIC PANEL - Abnormal; Notable for the following components:   Sodium 134 (*)    Total Bilirubin 0.2 (*)    All other components within normal limits  URINALYSIS, ROUTINE W REFLEX MICROSCOPIC - Abnormal; Notable for the following components:   APPearance HAZY (*)    All other components within normal limits  I-STAT BETA HCG BLOOD, ED ([facility], [facility], [facility] ONLY)    EKG None  Radiology No results found.  Procedures Procedures (including critical care time)  Medications Ordered in ED Medications - No data to display  ED Course  I have reviewed the triage vital signs and the nursing notes.  Pertinent labs & imaging results that were available during my care of the patient were reviewed by me and considered in my medical decision making (see chart for details).    MDM Rules/Calculators/A&P                          Patient with persistent vomiting for about a week.  [redacted] weeks pregnant.  No vaginal bleeding.  Discussed with Feb, CNM at [facility], who accepts in transfer. Final Clinical Impression(s) / ED Diagnoses Final diagnoses:  Nausea and vomiting in pregnancy    Rx / DC Orders ED Discharge Orders    None       Denny Peon, PA-C 04/26/20 0132    06/26/20, MD 04/26/20 [phone]

## 2020-05-10 ENCOUNTER — Ambulatory Visit (INDEPENDENT_AMBULATORY_CARE_PROVIDER_SITE_OTHER): Payer: Medicaid Other

## 2020-05-10 ENCOUNTER — Other Ambulatory Visit: Payer: Self-pay

## 2020-05-10 VITALS — BP 122/74 | HR 88 | Ht 67.0 in | Wt 149.8 lb

## 2020-05-10 DIAGNOSIS — Z3491 Encounter for supervision of normal pregnancy, unspecified, first trimester: Secondary | ICD-10-CM

## 2020-05-10 DIAGNOSIS — Z3687 Encounter for antenatal screening for uncertain dates: Secondary | ICD-10-CM | POA: Diagnosis not present

## 2020-05-10 DIAGNOSIS — O3680X0 Pregnancy with inconclusive fetal viability, not applicable or unspecified: Secondary | ICD-10-CM

## 2020-05-10 DIAGNOSIS — O0990 Supervision of high risk pregnancy, unspecified, unspecified trimester: Secondary | ICD-10-CM | POA: Insufficient documentation

## 2020-05-10 DIAGNOSIS — O219 Vomiting of pregnancy, unspecified: Secondary | ICD-10-CM

## 2020-05-10 MED ORDER — DOXYLAMINE-PYRIDOXINE 10-10 MG PO TBEC: DELAYED_RELEASE_TABLET | ORAL | 6 refills | Status: DC

## 2020-05-10 MED ORDER — BLOOD PRESSURE KIT DEVI: 1.0000 | 0 refills | Status: AC

## 2020-05-10 NOTE — Progress Notes (Signed)
PRENATAL INTAKE SUMMARY  Ms. Miklas presents today New OB Nurse Interview.  OB History    Gravida  5   Para  2   Term  2   Preterm      AB  2   Living  2     SAB  1   TAB  1   Ectopic      Multiple      Live Births  2          I have reviewed the patient's medical, obstetrical, social, and family histories, medications, and available lab results.  SUBJECTIVE She does complain of having nausea and vomiting. She states that the phenergan is not helping her and that she is not able to keep it down. Diclegis sent to the pharmacy per protocol.   OBJECTIVE Initial Physical Exam (New OB)  GENERAL APPEARANCE: alert, well appearing   ASSESSMENT Normal pregnancy  PLAN Prenatal care to be completed at [facility] New OB labs will be completed at New [facility] Intake Baby Scripts ordered Blood pressure monitoring kit sent to the pharmacy. U/S completed today. [redacted]w[redacted]d Single live IUP 

## 2020-05-17 ENCOUNTER — Telehealth: Payer: Self-pay

## 2020-05-17 ENCOUNTER — Other Ambulatory Visit (HOSPITAL_COMMUNITY)
Admission: RE | Admit: 2020-05-17 | Discharge: 2020-05-17 | Disposition: A | Discharge status: Home / Self Care | Payer: Medicaid Other | Source: Ambulatory Visit | Attending: Obstetrics | Admitting: Obstetrics

## 2020-05-17 ENCOUNTER — Other Ambulatory Visit: Payer: Self-pay

## 2020-05-17 ENCOUNTER — Ambulatory Visit (INDEPENDENT_AMBULATORY_CARE_PROVIDER_SITE_OTHER): Payer: Medicaid Other | Admitting: Obstetrics

## 2020-05-17 ENCOUNTER — Encounter: Payer: Self-pay | Admitting: Obstetrics

## 2020-05-17 VITALS — BP 112/67 | HR 80 | Wt 151.0 lb

## 2020-05-17 DIAGNOSIS — Z8759 Personal history of other complications of pregnancy, childbirth and the puerperium: Secondary | ICD-10-CM

## 2020-05-17 DIAGNOSIS — Z3482 Encounter for supervision of other normal pregnancy, second trimester: Secondary | ICD-10-CM

## 2020-05-17 DIAGNOSIS — O99321 Drug use complicating pregnancy, first trimester: Secondary | ICD-10-CM

## 2020-05-17 DIAGNOSIS — Z3A13 13 weeks gestation of pregnancy: Secondary | ICD-10-CM | POA: Diagnosis not present

## 2020-05-17 DIAGNOSIS — O99320 Drug use complicating pregnancy, unspecified trimester: Secondary | ICD-10-CM

## 2020-05-17 DIAGNOSIS — F1990 Other psychoactive substance use, unspecified, uncomplicated: Secondary | ICD-10-CM

## 2020-05-17 DIAGNOSIS — O0990 Supervision of high risk pregnancy, unspecified, unspecified trimester: Secondary | ICD-10-CM

## 2020-05-17 DIAGNOSIS — O99330 Smoking (tobacco) complicating pregnancy, unspecified trimester: Secondary | ICD-10-CM

## 2020-05-17 DIAGNOSIS — F17200 Nicotine dependence, unspecified, uncomplicated: Secondary | ICD-10-CM

## 2020-05-17 DIAGNOSIS — Z3491 Encounter for supervision of normal pregnancy, unspecified, first trimester: Secondary | ICD-10-CM | POA: Diagnosis present

## 2020-05-17 DIAGNOSIS — O99331 Smoking (tobacco) complicating pregnancy, first trimester: Secondary | ICD-10-CM

## 2020-05-17 MED ORDER — BUPROPION HCL ER (XL) 150 MG PO TB24: ORAL_TABLET | ORAL | 2 refills | Status: DC

## 2020-05-17 MED ORDER — VITAFOL ULTRA 29-0.6-0.4-200 MG PO CAPS: 1.0000 | ORAL_CAPSULE | Freq: Every day | ORAL | 4 refills | Status: AC

## 2020-05-17 NOTE — Progress Notes (Signed)
Subjective:    Molly Thornton is being seen today for her first obstetrical visit.  This is not a planned pregnancy. She is at [redacted]w[redacted]d gestation. Her obstetrical history is significant for substance abuse and pre-eclampsia. Relationship with FOB: significant other, living together. Patient does not intend to breast feed. Pregnancy history fully reviewed.  The information documented in the HPI was reviewed and verified.  Menstrual History: OB History    Gravida  5   Para  2   Term  2   Preterm      AB  2   Living  2     SAB  1   TAB  1   Ectopic      Multiple      Live Births  2            Patient's last menstrual period was 02/13/2020 (exact date).    Past Medical History:  Diagnosis Date  . Anxiety   . Bipolar disorder (HCC)   . Depression   . Hepatitis C    "never tx'd" (05/02/2018)  . Migraine    "daily" (05/02/2018)  . PTSD (post-traumatic stress disorder)   . Sickle cell trait [facility])     Past Surgical History:  Procedure Laterality Date  . WISDOM TOOTH EXTRACTION      (Not in a hospital admission)  Allergies  Allergen Reactions  . Hydrocodone Hives  . Ivp Dye [Iodinated Diagnostic Agents] Rash    Social History   Tobacco Use  . Smoking status: Current Every Day Smoker    Packs/day: 0.50    Years: 17.00    Pack years: 8.50    Types: Cigarettes  . Smokeless tobacco: Never Used  Substance Use Topics  . Alcohol use: Not Currently    Comment: 05/02/2018 "nothing since 2013"    Family History  Problem Relation Age of Onset  . Heart disease Father   . Hypertension Father   . Asthma Brother   . Diabetes Mother   . Pancreatitis Mother   . Coronary artery disease Mother   . Neuropathy Mother      Review of Systems Constitutional: negative for weight loss Gastrointestinal: negative for vomiting Genitourinary:negative for genital lesions and vaginal discharge and dysuria Musculoskeletal:negative for back pain Behavioral/Psych: negative for  abusive relationship, depression, illegal drug usage and tobacco use    Objective:    BP 112/67   Pulse 80   Wt 151 lb (68.5 kg)   LMP 02/13/2020 (Exact Date) Comment: had unusual bleeding  twice in Jan  BMI 23.65 kg/m  General Appearance:    Alert, cooperative, no distress, appears stated age  Head:    Normocephalic, without obvious abnormality, atraumatic  Eyes:    PERRL, conjunctiva/corneas clear, EOM's intact, fundi    benign, both eyes  Ears:    Normal TM's and external ear canals, both ears  Nose:   Nares normal, septum midline, mucosa normal, no drainage    or sinus tenderness  Throat:   Lips, mucosa, and tongue normal; teeth and gums normal  Neck:   Supple, symmetrical, trachea midline, no adenopathy;    thyroid:  no enlargement/tenderness/nodules; no carotid   bruit or JVD  Back:     Symmetric, no curvature, ROM normal, no CVA tenderness  Lungs:     Clear to auscultation bilaterally, respirations unlabored  Chest Wall:    No tenderness or deformity   Heart:    Regular rate and rhythm, S1 and S2 normal, no murmur,  rub   or gallop  Breast Exam:    No tenderness, masses, or nipple abnormality  Abdomen:     Soft, non-tender, bowel sounds active all four quadrants,    no masses, no organomegaly  Genitalia:    Normal female without lesion, discharge or tenderness  Extremities:   Extremities normal, atraumatic, no cyanosis or edema  Pulses:   2+ and symmetric all extremities  Skin:   Skin color, texture, turgor normal, no rashes or lesions  Lymph nodes:   Cervical, supraclavicular, and axillary nodes normal  Neurologic:   CNII-XII intact, normal strength, sensation and reflexes    throughout      Lab Review Urine pregnancy test Labs reviewed yes Radiologic studies reviewed no  Assessment:    Pregnancy at [redacted]w[redacted]d weeks    Plan:     1. Supervision of high risk pregnancy, antepartum Rx: - Cytology - PAP( [facility]) - Cervicovaginal ancillary only( [facility]) -  CBC/D/Plt+RPR+Rh+ABO+Rub Ab... - Genetic Screening - Culture, OB Urine - Prenat-Fe Poly-Methfol-FA-DHA (VITAFOL ULTRA) 29-0.6-0.4-200 MG CAPS; Take 1 capsule by mouth daily before breakfast.  Dispense: 90 capsule; Refill: 4  2. 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 Tobacco smoking affecting pregnancy, antepartum - cessation recommended for the health of the pregnancy and the baby's growth and development   Prenatal vitamins.  Counseling provided regarding continued use of seat belts, cessation of alcohol consumption, smoking or use of illicit drugs; infection precautions i.e., influenza/TDAP immunizations, toxoplasmosis,CMV, parvovirus, listeria and varicella; workplace safety, exercise during pregnancy; routine dental care, safe medications, sexual activity, hot tubs, saunas, pools, travel, caffeine use, fish and methlymercury, potential toxins, hair treatments, varicose veins Weight gain recommendations per IOM guidelines reviewed: underweight/BMI< 18.5--> gain 28 - 40 lbs; normal weight/BMI 18.5 - 24.9--> gain 25 - 35 lbs; overweight/BMI 25 - 29.9--> gain 15 - 25 lbs; obese/BMI >30->gain  11 - 20 lbs Problem list reviewed and updated. FIRST/CF mutation testing/NIPT/QUAD SCREEN/fragile X/Ashkenazi Jewish population testing/Spinal muscular atrophy discussed: requested. Role of ultrasound in pregnancy discussed; fetal survey: requested. Amniocentesis discussed: not indicated.  Meds ordered this encounter  Medications  . Prenat-Fe Poly-Methfol-FA-DHA (VITAFOL ULTRA) 29-0.6-0.4-200 MG CAPS    Sig: Take 1 capsule by mouth daily before breakfast.    Dispense:  90 capsule    Refill:  4  . buPROPion (WELLBUTRIN XL) 150 MG 24 hr tablet    Sig: Take 1 tab po daily for 3 days, then increase to 1 tab po bid for 7-12 weeks.    Dispense:  60 tablet    Refill:  2   Orders Placed This Encounter  Procedures  . Culture, OB Urine  . CBC/D/Plt+RPR+Rh+ABO+Rub Ab...   . Genetic Screening    Follow up in 4 weeks. 50% of 20 min visit spent on counseling and coordination of care.    Brock Bad, MD 05/17/2020 2:29 PM

## 2020-05-17 NOTE — Telephone Encounter (Signed)
Returned pt's call, no answer LVM for pt to c/b

## 2020-05-17 NOTE — Patient Instructions (Signed)
Coping with Quitting Smoking  Quitting smoking is a physical and mental challenge. You will face cravings, withdrawal symptoms, and temptation. Before quitting, work with your health care provider to make a plan that can help you cope. Preparation can help you quit and keep you from giving in. How can I cope with cravings? Cravings usually last for 5-10 minutes. If you get through it, the craving will pass. Consider taking the following actions to help you cope with cravings:  Keep your mouth busy: ? Chew sugar-free gum. ? Suck on hard candies or a straw. ? Brush your teeth.  Keep your hands and body busy: ? Immediately change to a different activity when you feel a craving. ? Squeeze or play with a ball. ? Do an activity or a hobby, like making bead jewelry, practicing needlepoint, or working with wood. ? Mix up your normal routine. ? Take a short exercise break. Go for a quick walk or run up and down stairs. ? Spend time in public places where smoking is not allowed.  Focus on doing something kind or helpful for someone else.  Call a friend or family member to talk during a craving.  Join a support group.  Call a quit line, such as 1-800-QUIT-NOW.  Talk with your health care provider about medicines that might help you cope with cravings and make quitting easier for you. How can I deal with withdrawal symptoms? Your body may experience negative effects as it tries to get used to not having nicotine in the system. These effects are called withdrawal symptoms. They may include:  Feeling hungrier than normal.  Trouble concentrating.  Irritability.  Trouble sleeping.  Feeling depressed.  Restlessness and agitation.  Craving a cigarette. To manage withdrawal symptoms:  Avoid places, people, and activities that trigger your cravings.  Remember why you want to quit.  Get plenty of sleep.  Avoid coffee and other caffeinated drinks. These may worsen some of your  symptoms. How can I handle social situations? Social situations can be difficult when you are quitting smoking, especially in the first few weeks. To manage this, you can:  Avoid parties, bars, and other social situations where people might be smoking.  Avoid alcohol.  Leave right away if you have the urge to smoke.  Explain to your family and friends that you are quitting smoking. Ask for understanding and support.  Plan activities with friends or family where smoking is not an option. What are some ways I can cope with stress? Wanting to smoke may cause stress, and stress can make you want to smoke. Find ways to manage your stress. Relaxation techniques can help. For example:  Breathe slowly and deeply, in through your nose and out through your mouth.  Listen to soothing, relaxing music.  Talk with a family member or friend about your stress.  Light a candle.  Soak in a bath or take a shower.  Think about a peaceful place. What are some ways I can prevent weight gain? Be aware that many people gain weight after they quit smoking. However, not everyone does. To keep from gaining weight, have a plan in place before you quit and stick to the plan after you quit. Your plan should include:  Having healthy snacks. When you have a craving, it may help to: ? Eat plain popcorn, crunchy carrots, celery, or other cut vegetables. ? Chew sugar-free gum.  Changing how you eat: ? Eat small portion sizes at meals. ? Eat 4-6 small meals   throughout the day instead of 1-2 large meals a day. ? Be mindful when you eat. Do not watch television or do other things that might distract you as you eat.  Exercising regularly: ? Make time to exercise each day. If you do not have time for a long workout, do short bouts of exercise for 5-10 minutes several times a day. ? Do some form of strengthening exercise, like weight lifting, and some form of aerobic exercise, like running or swimming.  Drinking  plenty of water or other low-calorie or no-calorie drinks. Drink 6-8 glasses of water daily, or as much as instructed by your health care provider. Summary  Quitting smoking is a physical and mental challenge. You will face cravings, withdrawal symptoms, and temptation to smoke again. Preparation can help you as you go through these challenges.  You can cope with cravings by keeping your mouth busy (such as by chewing gum), keeping your body and hands busy, and making calls to family, friends, or a helpline for people who want to quit smoking.  You can cope with withdrawal symptoms by avoiding places where people smoke, avoiding drinks with caffeine, and getting plenty of rest.  Ask your health care provider about the different ways to prevent weight gain, avoid stress, and handle social situations. This information is not intended to replace advice given to you by your health care provider. Make sure you discuss any questions you have with your health care provider. Document Revised: 08/23/2017 Document Reviewed: 09/07/2016 Elsevier Patient Education  2020 [place] Risks of Smoking Smoking cigarettes is very bad for your health. Tobacco smoke has over 200 known poisons in it. It contains the poisonous gases nitrogen oxide and carbon monoxide. There are over 60 chemicals in tobacco smoke that cause cancer. Smoking is difficult to quit because a chemical in tobacco, called nicotine, causes addiction or dependence. When you smoke and inhale, nicotine is absorbed rapidly into the bloodstream through your lungs. Both inhaled and non-inhaled nicotine may be addictive. What are the risks of cigarette smoke? Cigarette smokers have an increased risk of many serious medical problems, including:  Lung cancer.  Lung disease, such as pneumonia, bronchitis, and emphysema.  Chest pain (angina) and heart attack because the heart is not getting enough oxygen.  Heart disease and peripheral  blood vessel disease.  High blood pressure (hypertension).  Stroke.  Oral cancer, including cancer of the lip, mouth, or voice box.  Bladder cancer.  Pancreatic cancer.  Cervical cancer.  Pregnancy complications, including premature birth.  Stillbirths and smaller newborn babies, birth defects, and genetic damage to sperm.  Early menopause.  Lower estrogen level for women.  Infertility.  Facial wrinkles.  Blindness.  Increased risk of broken bones (fractures).  Senile dementia.  Stomach ulcers and internal bleeding.  Delayed wound healing and increased risk of complications during surgery.  Even smoking lightly shortens your life expectancy by several years. Because of secondhand smoke exposure, children of smokers have an increased risk of the following:  Sudden infant death syndrome (SIDS).  Respiratory infections.  Lung cancer.  Heart disease.  Ear infections. What are the benefits of quitting? There are many health benefits of quitting smoking. Here are some of them:  Within days of quitting smoking, your risk of having a heart attack decreases, your blood flow improves, and your lung capacity improves. Blood pressure, pulse rate, and breathing patterns start returning to normal soon after quitting.  Within months, your lungs may clear up completely.  Quitting for 10 years reduces your risk of developing lung cancer and heart disease to almost that of a nonsmoker.  People who quit may see an improvement in their overall quality of life. How do I quit smoking?     Smoking is an addiction with both physical and psychological effects, and longtime habits can be hard to change. Your health care provider can recommend:  Programs and community resources, which may include group support, education, or talk therapy.  Prescription medicines to help reduce cravings.  Nicotine replacement products, such as patches, gum, and nasal sprays. Use these products  only as directed. Do not replace cigarette smoking with electronic cigarettes, which are commonly called e-cigarettes. The safety of e-cigarettes is not known, and some may contain harmful chemicals.  A combination of two or more of these methods. Where to find more information  American Lung Association: www.lung.org  American Cancer Society: www.cancer.org Summary  Smoking cigarettes is very bad for your health. Cigarette smokers have an increased risk of many serious medical problems, including several cancers, heart disease, and stroke.  Smoking is an addiction with both physical and psychological effects, and longtime habits can be hard to change.  By stopping right away, you can greatly reduce the risk of medical problems for you and your family.  To help you quit smoking, your health care provider can recommend programs, community resources, prescription medicines, and nicotine replacement products such as patches, gum, and nasal sprays. This information is not intended to replace advice given to you by your health care provider. Make sure you discuss any questions you have with your health care provider. Document Revised: 12/12/2017 Document Reviewed: 09/14/2016 Elsevier Patient Education  2020 [employer].  Steps to Quit Smoking Smoking tobacco is the leading cause of preventable death. It can affect almost every organ in the body. Smoking puts you and people around you at risk for many serious, long-lasting (chronic) diseases. Quitting smoking can be hard, but it is one of the best things that you can do for your health. It is never too late to quit. How do I get ready to quit? When you decide to quit smoking, make a plan to help you succeed. Before you quit:  Pick a date to quit. Set a date within the next 2 weeks to give you time to prepare.  Write down the reasons why you are quitting. Keep this list in places where you will see it often.  Tell your family, friends, and  co-workers that you are quitting. Their support is important.  Talk with your doctor about the choices that may help you quit.  Find out if your health insurance will pay for these treatments.  Know the people, places, things, and activities that make you want to smoke (triggers). Avoid them. What first steps can I take to quit smoking?  Throw away all cigarettes at home, at work, and in your car.  Throw away the things that you use when you smoke, such as ashtrays and lighters.  Clean your car. Make sure to empty the ashtray.  Clean your home, including curtains and carpets. What can I do to help me quit smoking? Talk with your doctor about taking medicines and seeing a counselor at the same time. You are more likely to succeed when you do both.  If you are pregnant or breastfeeding, talk with your doctor about counseling or other ways to quit smoking. Do not take medicine to help you quit smoking unless your doctor  tells you to do so. To quit smoking: Quit right away  Quit smoking totally, instead of slowly cutting back on how much you smoke over a period of time.  Go to counseling. You are more likely to quit if you go to counseling sessions regularly. Take medicine You may take medicines to help you quit. Some medicines need a prescription, and some you can buy over-the-counter. Some medicines may contain a drug called nicotine to replace the nicotine in cigarettes. Medicines may:  Help you to stop having the desire to smoke (cravings).  Help to stop the problems that come when you stop smoking (withdrawal symptoms). Your doctor may ask you to use:  Nicotine patches, gum, or lozenges.  Nicotine inhalers or sprays.  Non-nicotine medicine that is taken by mouth. Find resources Find resources and other ways to help you quit smoking and remain smoke-free after you quit. These resources are most helpful when you use them often. They include:  Online chats with a  Veterinary surgeon.  Phone quitlines.  Printed Materials engineer.  Support groups or group counseling.  Text messaging programs.  Mobile phone apps. Use apps on your mobile phone or tablet that can help you stick to your quit plan. There are many free apps for mobile phones and tablets as well as websites. Examples include Quit Guide from the [employer] and smokefree.gov  What things can I do to make it easier to quit?   Talk to your family and friends. Ask them to support and encourage you.  Call a phone quitline (1-800-QUIT-NOW), reach out to support groups, or work with a Veterinary surgeon.  Ask people who smoke to not smoke around you.  Avoid places that make you want to smoke, such as: ? Bars. ? Parties. ? Smoke-break areas at work.  Spend time with people who do not smoke.  Lower the stress in your life. Stress can make you want to smoke. Try these things to help your stress: ? Getting regular exercise. ? Doing deep-breathing exercises. ? Doing yoga. ? Meditating. ? Doing a body scan. To do this, close your eyes, focus on one area of your body at a time from head to toe. Notice which parts of your body are tense. Try to relax the muscles in those areas. How will I feel when I quit smoking? Day 1 to 3 weeks Within the first 24 hours, you may start to have some problems that come from quitting tobacco. These problems are very bad 2-3 days after you quit, but they do not often last for more than 2-3 weeks. You may get these symptoms:  Mood swings.  Feeling restless, nervous, angry, or annoyed.  Trouble concentrating.  Dizziness.  Strong desire for high-sugar foods and nicotine.  Weight gain.  Trouble pooping (constipation).  Feeling like you may vomit (nausea).  Coughing or a sore throat.  Changes in how the medicines that you take for other issues work in your body.  Depression.  Trouble sleeping (insomnia). Week 3 and afterward After the first 2-3 weeks of quitting, you  may start to notice more positive results, such as:  Better sense of smell and taste.  Less coughing and sore throat.  Slower heart rate.  Lower blood pressure.  Clearer skin.  Better breathing.  Fewer sick days. Quitting smoking can be hard. Do not give up if you fail the first time. Some people need to try a few times before they succeed. Do your best to stick to your quit plan, and  talk with your doctor if you have any questions or concerns. Summary  Smoking tobacco is the leading cause of preventable death. Quitting smoking can be hard, but it is one of the best things that you can do for your health.  When you decide to quit smoking, make a plan to help you succeed.  Quit smoking right away, not slowly over a period of time.  When you start quitting, seek help from your doctor, family, or friends. This information is not intended to replace advice given to you by your health care provider. Make sure you discuss any questions you have with your health care provider. Document Revised: 06/05/2019 Document Reviewed: 11/29/2018 Elsevier Patient Education  [place].

## 2020-05-17 NOTE — Progress Notes (Signed)
Pt has [facility] management- pt was started today at 15mg .  Pt states she is having trouble with smoking cessation, would like recommendations.

## 2020-05-18 ENCOUNTER — Other Ambulatory Visit: Payer: Self-pay | Admitting: Obstetrics

## 2020-05-18 DIAGNOSIS — B9689 Other specified bacterial agents as the cause of diseases classified elsewhere: Secondary | ICD-10-CM

## 2020-05-18 DIAGNOSIS — N760 Acute vaginitis: Secondary | ICD-10-CM

## 2020-05-18 LAB — CYTOLOGY - PAP
Comment: NEGATIVE
Diagnosis: NEGATIVE
High risk HPV: NEGATIVE

## 2020-05-18 MED ORDER — METRONIDAZOLE 500 MG PO TABS: 500.0000 mg | ORAL_TABLET | Freq: Two times a day (BID) | ORAL | 2 refills | Status: DC

## 2020-05-19 ENCOUNTER — Other Ambulatory Visit: Payer: Medicaid Other

## 2020-05-19 ENCOUNTER — Other Ambulatory Visit: Payer: Self-pay | Admitting: Obstetrics

## 2020-05-19 DIAGNOSIS — B373 Candidiasis of vulva and vagina: Secondary | ICD-10-CM

## 2020-05-19 DIAGNOSIS — B3731 Acute candidiasis of vulva and vagina: Secondary | ICD-10-CM

## 2020-05-19 LAB — CERVICOVAGINAL ANCILLARY ONLY
Bacterial Vaginitis (gardnerella): POSITIVE — AB
Candida Glabrata: NEGATIVE
Candida Vaginitis: POSITIVE — AB
Chlamydia: NEGATIVE
Comment: NEGATIVE
Comment: NEGATIVE
Comment: NEGATIVE
Comment: NEGATIVE
Comment: NEGATIVE
Comment: NORMAL
Neisseria Gonorrhea: NEGATIVE
Trichomonas: NEGATIVE

## 2020-05-19 LAB — CULTURE, OB URINE

## 2020-05-19 LAB — URINE CULTURE, OB REFLEX

## 2020-05-19 MED ORDER — TERCONAZOLE 0.4 % VA CREA: 1.0000 | TOPICAL_CREAM | Freq: Every day | VAGINAL | 0 refills | Status: DC

## 2020-05-23 ENCOUNTER — Other Ambulatory Visit: Payer: Self-pay

## 2020-05-23 ENCOUNTER — Ambulatory Visit (INDEPENDENT_AMBULATORY_CARE_PROVIDER_SITE_OTHER): Payer: PRIVATE HEALTH INSURANCE | Admitting: Obstetrics and Gynecology

## 2020-05-23 ENCOUNTER — Encounter: Payer: Self-pay | Admitting: Obstetrics and Gynecology

## 2020-05-23 VITALS — BP 116/80 | HR 70 | Wt 156.0 lb

## 2020-05-23 DIAGNOSIS — O99320 Drug use complicating pregnancy, unspecified trimester: Secondary | ICD-10-CM

## 2020-05-23 DIAGNOSIS — O0990 Supervision of high risk pregnancy, unspecified, unspecified trimester: Secondary | ICD-10-CM

## 2020-05-23 DIAGNOSIS — O99330 Smoking (tobacco) complicating pregnancy, unspecified trimester: Secondary | ICD-10-CM

## 2020-05-23 MED ORDER — TERCONAZOLE 0.8 % VA CREA
1.0000 | TOPICAL_CREAM | Freq: Every day | VAGINAL | 0 refills | Status: DC
Start: 2020-05-23 — End: 2020-10-25

## 2020-05-23 MED ORDER — BUPROPION HCL ER (XL) 150 MG PO TB24: ORAL_TABLET | ORAL | 2 refills | Status: DC

## 2020-05-23 MED ORDER — METRONIDAZOLE 500 MG PO TABS: 500.0000 mg | ORAL_TABLET | Freq: Two times a day (BID) | ORAL | 0 refills | Status: DC

## 2020-05-23 NOTE — Progress Notes (Signed)
Pt presents for Methadone management.  Pt reqs to discuss taking Wellbutrin while pregnant.

## 2020-05-23 NOTE — Progress Notes (Signed)
   PRENATAL VISIT NOTE  Subjective:  Molly Thornton is a 34 y.o. [id number] at [redacted]w[redacted]d being seen today for ongoing prenatal care.  She is currently monitored for the following issues for this high-risk pregnancy and has Nausea & vomiting; Acute nonintractable headache; Pyelonephritis; Chronic hepatitis C without hepatic coma (HCC); Cocaine abuse (HCC); Opiate abuse, continuous (HCC); Cannabis abuse; Benzodiazepine abuse (HCC); Substance induced mood disorder (HCC); Supervision of high risk pregnancy, antepartum; and Substance abuse affecting pregnancy, antepartum on their problem list.  Patient reports no complaints.  Contractions: Not present. Vag. Bleeding: None.  Movement: Present. Denies leaking of fluid.   The following portions of the patient's history were reviewed and updated as appropriate: allergies, current medications, past family history, past medical history, past social history, past surgical history and problem list.   Objective:   Vitals:   05/23/20 1354  BP: 116/80  Pulse: 70  Weight: 156 lb (70.8 kg)    Fetal Status:     Movement: Present     General:  Alert, oriented and cooperative. Patient is in no acute distress.  Skin: Skin is warm and dry. No rash noted.   Cardiovascular: Normal heart rate noted  Respiratory: Normal respiratory effort, no problems with respiration noted  Abdomen: Soft, gravid, appropriate for gestational age.  Pain/Pressure: Absent     Pelvic: Cervical exam deferred        Extremities: Normal range of motion.  Edema: None  Mental Status: Normal mood and affect. Normal behavior. Normal judgment and thought content.   Assessment and Plan:  Pregnancy: [id number] at [redacted]w[redacted]d 1. Supervision of high risk pregnancy, antepartum PAtient without any obstetrical complaints today  2. 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 She presents today in the hopes of receiving a prescription for methadone. She missed  her weekend dose because she relies on friends for transportation to methadone clinic and arrived 10 minutes late. Patient was very upset when she was informed of the miscommunication and that methadone is dispensed by methadone clinics. She declined transitioning to suboxone. She declined speaking with social worker  3. Tobacco smoking affecting pregnancy, antepartum  - buPROPion (WELLBUTRIN XL) 150 MG 24 hr tablet; Take 1 tab po daily for 3 days, then increase to 1 tab po bid for 7-12 weeks.  Dispense: 60 tablet; Refill: 2  Preterm labor symptoms and general obstetric precautions including but not limited to vaginal bleeding, contractions, leaking of fluid and fetal movement were reviewed in detail with the patient. Please refer to After Visit Summary for other counseling recommendations.   Return for As scheduled in september.  Future Appointments  Date Time Provider Department Center  06/14/2020  1:45 PM Anyanwu, Jethro Bastos, MD [facility] None    Catalina Antigua, MD

## 2020-05-28 LAB — CBC/D/PLT+RPR+RH+ABO+RUB AB...
Antibody Screen: NEGATIVE
Basophils Absolute: 0.1 10*3/uL (ref 0.0–0.2)
Basos: 1 %
EOS (ABSOLUTE): 0.4 10*3/uL (ref 0.0–0.4)
Eos: 4 %
HCV Ab: >11 s/co ratio — ABNORMAL HIGH (ref 0.0–0.9)
HIV Screen 4th Generation wRfx: NONREACTIVE
Hematocrit: 36.2 % (ref 34.0–46.6)
Hemoglobin: 12.3 g/dL (ref 11.1–15.9)
Hepatitis B Surface Ag: NEGATIVE
Immature Grans (Abs): 0 10*3/uL (ref 0.0–0.1)
Immature Granulocytes: 0 %
Lymphocytes Absolute: 3 10*3/uL (ref 0.7–3.1)
Lymphs: 28 %
MCH: 30.8 pg (ref 26.6–33.0)
MCHC: 34 g/dL (ref 31.5–35.7)
MCV: 91 fL (ref 79–97)
Monocytes Absolute: 0.6 10*3/uL (ref 0.1–0.9)
Monocytes: 6 %
Neutrophils Absolute: 6.6 10*3/uL (ref 1.4–7.0)
Neutrophils: 61 %
Platelets: 230 10*3/uL (ref 150–450)
RBC: 3.99 x10E6/uL (ref 3.77–5.28)
RDW: 13 % (ref 11.7–15.4)
RPR Ser Ql: NONREACTIVE
Rh Factor: POSITIVE
Rubella Antibodies, IGG: 5.79 index (ref >0.99)
WBC: 10.7 10*3/uL (ref 3.4–10.8)

## 2020-05-28 LAB — HCV RNA NAA QUALITATIVE: HCV RNA NAA QUALITATIVE: NEGATIVE

## 2020-05-28 LAB — INTERPRETATION

## 2020-05-31 ENCOUNTER — Encounter: Payer: Self-pay | Admitting: Obstetrics and Gynecology

## 2020-06-02 ENCOUNTER — Encounter: Payer: Self-pay | Admitting: Obstetrics and Gynecology

## 2020-06-10 ENCOUNTER — Other Ambulatory Visit: Payer: Self-pay

## 2020-06-10 DIAGNOSIS — O0990 Supervision of high risk pregnancy, unspecified, unspecified trimester: Secondary | ICD-10-CM

## 2020-06-10 NOTE — Progress Notes (Signed)
Pt states she never received Baby[facility] link  Link sent today.

## 2020-06-14 ENCOUNTER — Telehealth (INDEPENDENT_AMBULATORY_CARE_PROVIDER_SITE_OTHER): Payer: Medicaid Other | Admitting: Obstetrics and Gynecology

## 2020-06-14 VITALS — BP 114/64 | HR 84

## 2020-06-14 DIAGNOSIS — F1120 Opioid dependence, uncomplicated: Secondary | ICD-10-CM

## 2020-06-14 DIAGNOSIS — O99320 Drug use complicating pregnancy, unspecified trimester: Secondary | ICD-10-CM

## 2020-06-14 DIAGNOSIS — B182 Chronic viral hepatitis C: Secondary | ICD-10-CM

## 2020-06-14 DIAGNOSIS — K5900 Constipation, unspecified: Secondary | ICD-10-CM

## 2020-06-14 DIAGNOSIS — O99612 Diseases of the digestive system complicating pregnancy, second trimester: Secondary | ICD-10-CM

## 2020-06-14 DIAGNOSIS — O0990 Supervision of high risk pregnancy, unspecified, unspecified trimester: Secondary | ICD-10-CM

## 2020-06-14 DIAGNOSIS — Z3A17 17 weeks gestation of pregnancy: Secondary | ICD-10-CM

## 2020-06-14 DIAGNOSIS — O98412 Viral hepatitis complicating pregnancy, second trimester: Secondary | ICD-10-CM

## 2020-06-14 DIAGNOSIS — O99322 Drug use complicating pregnancy, second trimester: Secondary | ICD-10-CM

## 2020-06-14 MED ORDER — POLYETHYLENE GLYCOL 3350 17 G PO PACK: 17.0000 g | PACK | Freq: Two times a day (BID) | ORAL | 1 refills | Status: DC

## 2020-06-14 NOTE — Progress Notes (Signed)
I connected with  Molly Thornton on 06/14/20 by a video enabled telemedicine application and verified that I am speaking with the correct person using two identifiers. Patient is at home and I am at [facility]   I discussed the limitations of evaluation and management by telemedicine. The patient expressed understanding and agreed to proceed.  MyChart OB c/o constipation x 1 month, she also wants to switch from Methadone to [facility] and wants to know the Protocol.

## 2020-06-14 NOTE — Progress Notes (Signed)
   TELEHEALTH VIRTUAL OBSTETRICS VISIT ENCOUNTER NOTE  Clinic: [facility]  I connected with Toneka Dipierro on 06/14/20 at  1:45 PM EDT by telephone at home and verified that I am speaking with the correct person using two identifiers.   I discussed the limitations, risks, security and privacy concerns of performing an evaluation and management service by telephone and the availability of in person appointments. I also discussed with the patient that there may be a patient responsible charge related to this service. The patient expressed understanding and agreed to proceed.  Subjective:  Molly Thornton is a 34 y.o. [id number] at [redacted]w[redacted]d being followed for ongoing prenatal care.  She is currently monitored for the following issues for this high-risk pregnancy and has Nausea & vomiting; Acute nonintractable headache; History of pyelonephritis; Chronic hepatitis C without hepatic coma (HCC); Cocaine abuse (HCC); Opiate abuse, continuous (HCC); Cannabis abuse; Benzodiazepine abuse (HCC); Substance induced mood disorder (HCC); Supervision of high risk pregnancy, antepartum; and Substance abuse affecting pregnancy, antepartum on their problem list.  Patient reports constipation. pt interested in switching from methdaone to suboxone.. Reports fetal movement. Denies any contractions, bleeding or leaking of fluid.   The following portions of the patient's history were reviewed and updated as appropriate: allergies, current medications, past family history, past medical history, past social history, past surgical history and problem list.   Objective:   Vitals:   06/14/20 1322  BP: 114/64  Pulse: 84    Babyscripts Data Reviewed: not applicable  General:  Alert, oriented and cooperative.   Mental Status: Normal mood and affect perceived. Normal judgment and thought content.  Rest of physical exam deferred due to type of encounter  Assessment and Plan:  Pregnancy: [id number]  at [redacted]w[redacted]d 1. Substance abuse affecting pregnancy, antepartum Does not want to see dr. Jolayne Panther again. Will set up to see drs pratt or stinson. Continue methadone for now  2. Supervision of high risk pregnancy, antepartum Anatomy u/s to be scheduled. Recommend afp nv - [place] MFM OB DETAIL +14 WK; Future  3. Chronic hepatitis C without hepatic coma (HCC) Negative quant at nob  4. Constipation during pregnancy in second trimester miralax bid  Preterm labor symptoms and general obstetric precautions including but not limited to vaginal bleeding, contractions, leaking of fluid and fetal movement were reviewed in detail with the patient.  I discussed the assessment and treatment plan with the patient. The patient was provided an opportunity to ask questions and all were answered. The patient agreed with the plan and demonstrated an understanding of the instructions. The patient was advised to call back or seek an in-person office evaluation/go to [facility] at [facility] for any urgent or concerning symptoms. Please refer to After Visit Summary for other counseling recommendations.   I provided 10 minutes of non-face-to-face time during this encounter. The visit was conducted via MyChart-medicine  No follow-ups on file.  Future Appointments  Date Time Provider Department Center  06/14/2020  1:45 PM Mitchell Bing, MD [facility] None    McKenzie Bing, MD [facility], [facility]

## 2020-06-16 ENCOUNTER — Other Ambulatory Visit: Payer: Self-pay

## 2020-06-24 ENCOUNTER — Telehealth: Payer: PRIVATE HEALTH INSURANCE | Admitting: Family Medicine

## 2020-06-27 ENCOUNTER — Telehealth (INDEPENDENT_AMBULATORY_CARE_PROVIDER_SITE_OTHER): Payer: Medicaid Other | Admitting: Obstetrics and Gynecology

## 2020-06-27 DIAGNOSIS — O99320 Drug use complicating pregnancy, unspecified trimester: Secondary | ICD-10-CM

## 2020-06-27 DIAGNOSIS — O0990 Supervision of high risk pregnancy, unspecified, unspecified trimester: Secondary | ICD-10-CM

## 2020-06-27 NOTE — Progress Notes (Signed)
Called patient x 3, got no answer, left VMM to call Office.

## 2020-06-27 NOTE — Progress Notes (Signed)
Unable to reach patient for virtual appointment

## 2020-07-06 ENCOUNTER — Ambulatory Visit: Payer: Medicaid Other | Attending: Obstetrics and Gynecology

## 2020-07-06 ENCOUNTER — Ambulatory Visit: Payer: Medicaid Other | Admitting: *Deleted

## 2020-07-06 ENCOUNTER — Other Ambulatory Visit: Payer: Self-pay

## 2020-07-06 DIAGNOSIS — O99320 Drug use complicating pregnancy, unspecified trimester: Secondary | ICD-10-CM | POA: Insufficient documentation

## 2020-07-06 DIAGNOSIS — O0990 Supervision of high risk pregnancy, unspecified, unspecified trimester: Secondary | ICD-10-CM

## 2020-07-07 ENCOUNTER — Other Ambulatory Visit: Payer: Self-pay | Admitting: *Deleted

## 2020-07-07 DIAGNOSIS — O09529 Supervision of elderly multigravida, unspecified trimester: Secondary | ICD-10-CM

## 2020-08-22 ENCOUNTER — Telehealth: Payer: Medicaid Other | Admitting: Obstetrics & Gynecology

## 2020-08-29 ENCOUNTER — Ambulatory Visit: Payer: Medicaid Other

## 2020-08-30 ENCOUNTER — Ambulatory Visit (INDEPENDENT_AMBULATORY_CARE_PROVIDER_SITE_OTHER): Payer: Medicaid Other | Admitting: Advanced Practice Midwife

## 2020-08-30 ENCOUNTER — Other Ambulatory Visit: Payer: Self-pay

## 2020-08-30 ENCOUNTER — Other Ambulatory Visit: Payer: Medicaid Other

## 2020-08-30 ENCOUNTER — Encounter: Payer: Self-pay | Admitting: Advanced Practice Midwife

## 2020-08-30 VITALS — BP 127/78 | HR 88 | Wt 162.0 lb

## 2020-08-30 DIAGNOSIS — Z3A28 28 weeks gestation of pregnancy: Secondary | ICD-10-CM | POA: Diagnosis not present

## 2020-08-30 DIAGNOSIS — O0990 Supervision of high risk pregnancy, unspecified, unspecified trimester: Secondary | ICD-10-CM

## 2020-08-30 DIAGNOSIS — Z23 Encounter for immunization: Secondary | ICD-10-CM | POA: Diagnosis not present

## 2020-08-30 MED ORDER — COMFORT FIT MATERNITY SUPP MED MISC
1.0000 | Freq: Every day | 0 refills | Status: DC | PRN
Start: 2020-08-30 — End: 2020-11-06

## 2020-08-30 NOTE — Progress Notes (Signed)
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 Tdap given today R Del without difficulty Covid vaccines completed  Flu vaccine declined

## 2020-08-30 NOTE — Progress Notes (Signed)
   PRENATAL VISIT NOTE  Subjective:  Molly Thornton is a 34 y.o. [id number] at [redacted]w[redacted]d being seen today for ongoing prenatal care.  She is currently monitored for the following issues for this high-risk pregnancy and has Nausea & vomiting; Acute nonintractable headache; History of pyelonephritis; Chronic hepatitis C without hepatic coma (HCC); Cocaine abuse (HCC); Opiate abuse, continuous (HCC); Cannabis abuse; Benzodiazepine abuse (HCC); Substance induced mood disorder (HCC); Supervision of high risk pregnancy, antepartum; and Substance abuse affecting pregnancy, antepartum on their problem list.  Patient reports lower abdominal pain that sounds c/w round ligament pain.  Contractions: Irritability. Vag. Bleeding: None.  Movement: Present. Denies leaking of fluid.   The following portions of the patient's history were reviewed and updated as appropriate: allergies, current medications, past family history, past medical history, past social history, past surgical history and problem list.   Objective:   Vitals:   08/30/20 0859  BP: 127/78  Pulse: 88  Weight: 162 lb (73.5 kg)    Fetal Status: Fetal Heart Rate (bpm): 147 Fundal Height: 28 cm Movement: Present     General:  Alert, oriented and cooperative. Patient is in no acute distress.  Skin: Skin is warm and dry. No rash noted.   Cardiovascular: Normal heart rate noted  Respiratory: Normal respiratory effort, no problems with respiration noted  Abdomen: Soft, gravid, appropriate for gestational age.  Pain/Pressure: Present     Pelvic: Cervical exam deferred        Extremities: Normal range of motion.  Edema: None  Mental Status: Normal mood and affect. Normal behavior. Normal judgment and thought content.   Assessment and Plan:  Pregnancy: [id number] at [redacted]w[redacted]d 1. Supervision of high risk pregnancy, antepartum - Glucose Tolerance, 2 Hours w/1 Hour - HIV Antibody (routine testing w rflx) - RPR - CBC  2. [redacted] weeks gestation of  pregnancy - labs today  - Tdap given today   3. Round ligament pain - RX for pregnancy support belt provided to patient   Preterm labor symptoms and general obstetric precautions including but not limited to vaginal bleeding, contractions, leaking of fluid and fetal movement were reviewed in detail with the patient. Please refer to After Visit Summary for other counseling recommendations.   Return in about 2 weeks (around 09/13/2020).  Future Appointments  Date Time Provider Department Center  08/30/2020 10:35 AM Thressa Sheller D, CNM [facility] None  09/14/2020  3:15 PM [facility] NURSE [facility]  09/14/2020  3:30 PM [facility] US3 [facility]    Thressa Sheller DNP, CNM  08/30/20  9:35 AM

## 2020-08-30 NOTE — Patient Instructions (Signed)

## 2020-08-31 LAB — GLUCOSE TOLERANCE, 2 HOURS W/ 1HR
Glucose, 1 hour: 114 mg/dL (ref 65–179)
Glucose, 2 hour: 96 mg/dL (ref 65–152)
Glucose, Fasting: 94 mg/dL — ABNORMAL HIGH (ref 65–91)

## 2020-08-31 LAB — CBC
Hematocrit: 36.8 % (ref 34.0–46.6)
Hemoglobin: 12.7 g/dL (ref 11.1–15.9)
MCH: 30.9 pg (ref 26.6–33.0)
MCHC: 34.5 g/dL (ref 31.5–35.7)
MCV: 90 fL (ref 79–97)
Platelets: 221 10*3/uL (ref 150–450)
RBC: 4.11 x10E6/uL (ref 3.77–5.28)
RDW: 13.4 % (ref 11.7–15.4)
WBC: 10.4 10*3/uL (ref 3.4–10.8)

## 2020-08-31 LAB — RPR: RPR Ser Ql: NONREACTIVE

## 2020-08-31 LAB — HIV ANTIBODY (ROUTINE TESTING W REFLEX): HIV Screen 4th Generation wRfx: NONREACTIVE

## 2020-09-13 ENCOUNTER — Other Ambulatory Visit: Payer: Self-pay

## 2020-09-13 ENCOUNTER — Encounter: Payer: Self-pay | Admitting: Obstetrics and Gynecology

## 2020-09-13 ENCOUNTER — Ambulatory Visit (INDEPENDENT_AMBULATORY_CARE_PROVIDER_SITE_OTHER): Payer: Medicaid Other | Admitting: Obstetrics and Gynecology

## 2020-09-13 VITALS — BP 106/69 | HR 73 | Wt 169.0 lb

## 2020-09-13 DIAGNOSIS — O0990 Supervision of high risk pregnancy, unspecified, unspecified trimester: Secondary | ICD-10-CM

## 2020-09-13 DIAGNOSIS — O99320 Drug use complicating pregnancy, unspecified trimester: Secondary | ICD-10-CM

## 2020-09-13 DIAGNOSIS — B182 Chronic viral hepatitis C: Secondary | ICD-10-CM

## 2020-09-13 DIAGNOSIS — O99330 Smoking (tobacco) complicating pregnancy, unspecified trimester: Secondary | ICD-10-CM

## 2020-09-13 DIAGNOSIS — Z8759 Personal history of other complications of pregnancy, childbirth and the puerperium: Secondary | ICD-10-CM

## 2020-09-13 NOTE — Progress Notes (Signed)
ROB  Methadone Rx has been increased to 150 per pt.    CC: Hip pain.

## 2020-09-13 NOTE — Progress Notes (Signed)
   PRENATAL VISIT NOTE  Subjective:  Molly Thornton is a 34 y.o. [id number] at [redacted]w[redacted]d being seen today for ongoing prenatal care.  She is currently monitored for the following issues for this high-risk pregnancy and has Nausea & vomiting; Acute nonintractable headache; History of pyelonephritis; Chronic hepatitis C without hepatic coma (HCC); Cocaine abuse (HCC); Opiate abuse, continuous (HCC); Cannabis abuse; Benzodiazepine abuse (HCC); Substance induced mood disorder (HCC); Supervision of high risk pregnancy, antepartum; and Substance abuse affecting pregnancy, antepartum on their problem list.  Patient reports no complaints.  Contractions: Not present. Vag. Bleeding: None.  Movement: Present. Denies leaking of fluid.   The following portions of the patient's history were reviewed and updated as appropriate: allergies, current medications, past family history, past medical history, past social history, past surgical history and problem list.   Objective:   Vitals:   09/13/20 1039  BP: 106/69  Pulse: 73  Weight: 169 lb (76.7 kg)    Fetal Status: Fetal Heart Rate (bpm): 140 Fundal Height: 29 cm Movement: Present     General:  Alert, oriented and cooperative. Patient is in no acute distress.  Skin: Skin is warm and dry. No rash noted.   Cardiovascular: Normal heart rate noted  Respiratory: Normal respiratory effort, no problems with respiration noted  Abdomen: Soft, gravid, appropriate for gestational age.  Pain/Pressure: Present     Pelvic: Cervical exam deferred        Extremities: Normal range of motion.  Edema: Trace  Mental Status: Normal mood and affect. Normal behavior. Normal judgment and thought content.   Assessment and Plan:  Pregnancy: [id number] at [redacted]w[redacted]d 1. Supervision of high risk pregnancy, antepartum - Kick counts discussed.  2. Substance abuse affecting pregnancy, antepartum - Denies current use. - Patient for repeat growth scan tomorrow.  3. Chronic hepatitis C  without hepatic coma (HCC) - Stable.  4. History of pre-eclampsia - Recommend daily ASA. - PreEclampsia precautions discussed.  5. Tobacco smoking affecting pregnancy, antepartum - Risks of tobacco use in pregnancy discussed. - Cessation of tobacco use in pregnancy discussed.  Preterm labor symptoms and general obstetric precautions including but not limited to vaginal bleeding, contractions, leaking of fluid and fetal movement were reviewed in detail with the patient. Please refer to After Visit Summary for other counseling recommendations.   Return in about 2 weeks (around 09/27/2020) for HROB.  Future Appointments  Date Time Provider Department Center  09/14/2020  3:15 PM [facility] NURSE [facility]  09/14/2020  3:30 PM [facility] US3 [facility]    Johnny Bridge, MD

## 2020-09-14 ENCOUNTER — Encounter: Payer: Self-pay | Admitting: Advanced Practice Midwife

## 2020-09-14 ENCOUNTER — Ambulatory Visit (HOSPITAL_BASED_OUTPATIENT_CLINIC_OR_DEPARTMENT_OTHER): Payer: Medicaid Other

## 2020-09-14 ENCOUNTER — Encounter: Payer: Self-pay | Admitting: *Deleted

## 2020-09-14 ENCOUNTER — Ambulatory Visit: Payer: Medicaid Other | Attending: Obstetrics and Gynecology | Admitting: *Deleted

## 2020-09-14 DIAGNOSIS — Z3A30 30 weeks gestation of pregnancy: Secondary | ICD-10-CM | POA: Insufficient documentation

## 2020-09-14 DIAGNOSIS — F1990 Other psychoactive substance use, unspecified, uncomplicated: Secondary | ICD-10-CM

## 2020-09-14 DIAGNOSIS — O24419 Gestational diabetes mellitus in pregnancy, unspecified control: Secondary | ICD-10-CM | POA: Insufficient documentation

## 2020-09-14 DIAGNOSIS — O09522 Supervision of elderly multigravida, second trimester: Secondary | ICD-10-CM

## 2020-09-14 DIAGNOSIS — O09523 Supervision of elderly multigravida, third trimester: Secondary | ICD-10-CM | POA: Insufficient documentation

## 2020-09-14 DIAGNOSIS — Z362 Encounter for other antenatal screening follow-up: Secondary | ICD-10-CM | POA: Diagnosis not present

## 2020-09-14 DIAGNOSIS — O09529 Supervision of elderly multigravida, unspecified trimester: Secondary | ICD-10-CM

## 2020-09-14 DIAGNOSIS — O99320 Drug use complicating pregnancy, unspecified trimester: Secondary | ICD-10-CM

## 2020-09-14 DIAGNOSIS — O0990 Supervision of high risk pregnancy, unspecified, unspecified trimester: Secondary | ICD-10-CM

## 2020-09-14 DIAGNOSIS — O99323 Drug use complicating pregnancy, third trimester: Secondary | ICD-10-CM | POA: Diagnosis not present

## 2020-09-15 ENCOUNTER — Other Ambulatory Visit: Payer: Self-pay | Admitting: *Deleted

## 2020-09-15 DIAGNOSIS — F1120 Opioid dependence, uncomplicated: Secondary | ICD-10-CM

## 2020-09-21 ENCOUNTER — Other Ambulatory Visit: Payer: Medicaid Other

## 2020-09-24 NOTE — L&D Delivery Note (Addendum)
OB/GYN Faculty Practice Delivery Note  Molly Thornton is a 35 y.o. [id number] s/p SVD at [redacted]w[redacted]d. She was admitted for pre-eclampsia with severe features.   ROM: 3h 77m with clear fluid GBS Status: Negative Maximum Maternal Temperature: 100.41F  Labor Progress: . Started on magnesium given pre-E with severe features.  Foley bulb placed and given dose of cytotec x1.  Pitocin was started at 0400 and uptitrated as appropriate.  AROM at 1355.  She progressed to complete without complication.    Delivery Date/Time: 11/03/2020 at 1702 Delivery: Called to room and patient was complete and pushing. Head delivered LOA. No nuchal cord present. Shoulder and body delivered in usual fashion. Infant with spontaneous cry, placed on mother's abdomen, dried and stimulated. Infant did not immediately cry and cord was therefore clamped x2 and cut immediately.  Cord blood drawn. Placenta delivered spontaneously with gentle cord traction. Fundus firm with massage and Pitocin. Labia, perineum, vagina, and cervix were inspected, without evidence of lacerations.  Post-placental Liletta IUD was then placed by Dr. Barb Merino as noted below.  Placenta: Intact, 3 vessel cord, sent to L&D Complications: None Lacerations: None EBL: 75 cc Analgesia: Epidural  Infant: Viable female  APGARs 6 & 8  weight 2780g  EMILY Genene Churn, MD 11/03/2020, 5:29 PM  Post-Placental IUD Insertion Procedure Note  Patient identified, informed consent signed prior to delivery, signed copy in chart, time out was performed.    Vaginal, labial and perineal areas thoroughly inspected for lacerations; no evidence of lacerations.  - Liletta IUD grasped between sterile gloved fingers. Sterile lubrication applied to sterile gloved hand for ease of insertion. Fundus identified through abdominal wall using non-insertion hand. IUD inserted to fundus with bimanual technique. IUD carefully released at the fundus and insertion hand gently removed from vagina.     Strings trimmed to the level of the introitus. Patient tolerated procedure well.  Patient given post procedure instructions and IUD care card with expiration date.  Patient is asked to keep IUD strings tucked in her vagina until her postpartum follow up visit in 4-6 weeks. Patient advised to abstain from sexual intercourse and pulling on strings before her follow-up visit. Patient verbalized an understanding of the plan of care and agrees.   I was gloved and present for delivery of infant and placenta. I placed the post-placental IUD as noted above.  Sheila Oats, MD OB Fellow, Faculty Practice 11/03/2020 8:55 PM

## 2020-09-27 ENCOUNTER — Encounter: Payer: Medicaid Other | Admitting: Obstetrics & Gynecology

## 2020-10-05 ENCOUNTER — Telehealth (INDEPENDENT_AMBULATORY_CARE_PROVIDER_SITE_OTHER): Payer: Medicaid Other | Admitting: Women's Health

## 2020-10-05 ENCOUNTER — Other Ambulatory Visit: Payer: Self-pay

## 2020-10-05 DIAGNOSIS — B182 Chronic viral hepatitis C: Secondary | ICD-10-CM

## 2020-10-05 DIAGNOSIS — O99323 Drug use complicating pregnancy, third trimester: Secondary | ICD-10-CM

## 2020-10-05 DIAGNOSIS — O09299 Supervision of pregnancy with other poor reproductive or obstetric history, unspecified trimester: Secondary | ICD-10-CM | POA: Insufficient documentation

## 2020-10-05 DIAGNOSIS — O24419 Gestational diabetes mellitus in pregnancy, unspecified control: Secondary | ICD-10-CM

## 2020-10-05 DIAGNOSIS — O0990 Supervision of high risk pregnancy, unspecified, unspecified trimester: Secondary | ICD-10-CM

## 2020-10-05 DIAGNOSIS — O99320 Drug use complicating pregnancy, unspecified trimester: Secondary | ICD-10-CM

## 2020-10-05 DIAGNOSIS — F1994 Other psychoactive substance use, unspecified with psychoactive substance-induced mood disorder: Secondary | ICD-10-CM

## 2020-10-05 DIAGNOSIS — O98413 Viral hepatitis complicating pregnancy, third trimester: Secondary | ICD-10-CM

## 2020-10-05 DIAGNOSIS — Z3A33 33 weeks gestation of pregnancy: Secondary | ICD-10-CM

## 2020-10-05 DIAGNOSIS — Z87448 Personal history of other diseases of urinary system: Secondary | ICD-10-CM

## 2020-10-05 NOTE — Progress Notes (Signed)
I connected with Molly Thornton 10/05/20 at  9:55 AM EST by: MyChart video and verified that I am speaking with the correct person using two identifiers.  Patient is located at home and provider is located at [facility].     The purpose of this virtual visit is to provide medical care while limiting exposure to the novel coronavirus. I discussed the limitations, risks, security and privacy concerns of performing an evaluation and management service by MyChart video and the availability of in person appointments. I also discussed with the patient that there may be a patient responsible charge related to this service. By engaging in this virtual visit, you consent to the provision of healthcare.  Additionally, you authorize for your insurance to be billed for the services provided during this visit.  The patient expressed understanding and agreed to proceed.  The following staff members participated in the virtual visit:  Donia Ast    PRENATAL VISIT NOTE  Subjective:  Molly Thornton is a 35 y.o. [id number] at [redacted]w[redacted]d  for phone visit for ongoing prenatal care.  She is currently monitored for the following issues for this high-risk pregnancy and has Nausea & vomiting; Acute nonintractable headache; History of pyelonephritis; Chronic hepatitis C without hepatic coma (HCC); Cocaine abuse (HCC); Opiate abuse, continuous (HCC); Cannabis abuse; Benzodiazepine abuse (HCC); Substance induced mood disorder (HCC); Supervision of high risk pregnancy, antepartum; Substance abuse affecting pregnancy, antepartum; and Gestational diabetes on their problem list.  Patient reports no complaints.  Contractions: Irritability. Vag. Bleeding: None.  Movement: Present. Denies leaking of fluid.   The following portions of the patient's history were reviewed and updated as appropriate: allergies, current medications, past family history, past medical history, past social history, past surgical history and problem list.    Objective:  There were no vitals filed for this visit. Pt will take when she gets home and send [place] a MyChart message with the results.  Fetal Status:     Movement: Present     Assessment and Plan:  Pregnancy: [id number] at [redacted]w[redacted]d  1. Gestational diabetes mellitus (GDM) in third trimester, gestational diabetes method of control unspecified -pt failed GTT but this was because she ate after midnight, will repeat GTT ASAP, no GDM diagnosis at this time  2. Substance abuse affecting pregnancy, antepartum -currently on methadone  3. Supervision of high risk pregnancy, antepartum -contraception discussed, info given, pt elects IP IUD -not on low dose ASA, hx PEC  4. Substance induced mood disorder (HCC) -will start seeing psychiatrist through methadone clinic  5. Chronic hepatitis C without hepatic coma (HCC) -prior, treated infection  6. History of pyelonephritis -previous pregnancy  Preterm labor symptoms and general obstetric precautions including but not limited to vaginal bleeding, contractions, leaking of fluid and fetal movement were reviewed in detail with the patient. I discussed the assessment and treatment plan with the patient. The patient was provided an opportunity to ask questions and all were answered. The patient agreed with the plan and demonstrated an understanding of the instructions. The patient was advised to call back or seek an in-person office evaluation/go to [facility] at [facility] for any urgent or concerning symptoms.  Return in 19 days (on 10/24/2020) for ASAP for repeat GTT/ in-person HOB/MD ONLY for ROB.  Future Appointments  Date Time Provider Department Center  10/12/2020  3:00 PM [facility] NURSE [facility]  10/12/2020  3:15 PM [facility] US2 [facility]     Time spent on virtual visit: 10 minutes  Marylen Ponto, NP

## 2020-10-05 NOTE — Patient Instructions (Addendum)
Maternity Assessment Unit (MAU)  The Maternity Assessment Unit (MAU) is located at the [facility] and [place] [facility]. The address is: [address], [place], [place], [place] [address]. Please see map below for additional directions.    The Maternity Assessment Unit is designed to help you during your pregnancy, and for up to 6 weeks after delivery, with any pregnancy- or postpartum-related emergencies, if you think you are in labor, or if your water has broken. For example, if you experience nausea and vomiting, vaginal bleeding, severe abdominal or pelvic pain, elevated blood pressure or other problems related to your pregnancy or postpartum time, please come to the Maternity Assessment Unit for assistance.        Preterm Labor The normal length of a pregnancy is 39-41 weeks. Preterm labor is when labor starts before 37 completed weeks of pregnancy. Babies who are born prematurely and survive may not be fully developed and may be at an increased risk for long-term problems such as cerebral palsy, developmental delays, and vision and hearing problems. Babies who are born too early may have problems soon after birth. Problems may include regulating blood sugar, body temperature, heart rate, and breathing rate. These babies often have trouble with feeding. The risk of having problems is highest for babies who are born before 44 weeks of pregnancy. What are the causes? The exact cause of this condition is not known. What increases the risk? You are more likely to have preterm labor if you have certain risk factors that relate to your medical history, problems with present and past pregnancies, and lifestyle factors. Medical history  You have abnormalities of the uterus, including a short cervix.  You have STIs (sexually transmitted infections), or other infections of the urinary tract and the vagina.  You have chronic illnesses, such as blood clotting problems,  diabetes, or high blood pressure.  You are overweight or underweight. Present and past pregnancies  You have had preterm labor before.  You are pregnant with twins or other multiples.  You have been diagnosed with a condition in which the placenta covers your cervix (placenta previa).  You waited less than 6 months between giving birth and becoming pregnant again.  Your unborn baby has some abnormalities.  You have vaginal bleeding during pregnancy.  You became pregnant through in vitro fertilization (IVF). Lifestyle and environmental factors  You use tobacco products.  You drink alcohol.  You use street drugs.  You have stress and no social support.  You experience domestic violence.  You are exposed to certain chemicals or environmental pollutants. Other factors  You are younger than age 110 or older than age 19. What are the signs or symptoms? Symptoms of this condition include:  Cramps similar to those that can happen during a menstrual period. The cramps may happen with diarrhea.  Pain in the abdomen or lower back.  Regular contractions that may feel like tightening of the abdomen.  A feeling of increased pressure in the pelvis.  Increased watery or bloody mucus discharge from the vagina.  Water breaking (ruptured amniotic sac). How is this diagnosed? This condition is diagnosed based on:  Your medical history and a physical exam.  A pelvic exam.  An ultrasound.  Monitoring your uterus for contractions.  Other tests, including: ? A swab of the cervix to check for a chemical called fetal fibronectin. ? Urine tests. How is this treated? Treatment for this condition depends on the length of your pregnancy, your  condition, and the health of your baby. Treatment may include:  Taking medicines, such as: ? Hormone medicines. These may be given early in pregnancy to help support the pregnancy. ? Medicines to stop contractions. ? Medicines to help mature  the baby's lungs. These may be prescribed if the risk of delivery is high. ? Medicines to prevent your baby from developing cerebral palsy.  Bed rest. If the labor happens before 34 weeks of pregnancy, you may need to stay in the hospital.  Delivery of the baby. Follow these instructions at home:  Do not use any products that contain nicotine or tobacco, such as cigarettes, e-cigarettes, and chewing tobacco. If you need help quitting, ask your health care provider.  Do not drink alcohol.  Take over-the-counter and prescription medicines only as told by your health care provider.  Rest as told by your health care provider.  Return to your normal activities as told by your health care provider. Ask your health care provider what activities are safe for you.  Keep all follow-up visits as told by your health care provider. This is important.   How is this prevented? To increase your chance of having a full-term pregnancy:  Do not use street drugs or medicines that have not been prescribed to you during your pregnancy.  Talk with your health care provider before taking any herbal supplements, even if you have been taking them regularly.  Make sure you gain a healthy amount of weight during your pregnancy.  Watch for infection. If you think that you might have an infection, get it checked right away. Symptoms of infection may include: ? Fever. ? Abnormal vaginal discharge or discharge that smells bad. ? Pain or burning with urination. ? Needing to urinate urgently. ? Frequently urinating or passing small amounts of urine frequently. ? Blood in your urine. ? Urine that smells bad or unusual.  Tell your health care provider if you have had preterm labor before. Contact a health care provider if:  You think you are going into preterm labor.  You have signs or symptoms of preterm labor.  You have symptoms of infection. Get help right away if:  You are having regular, painful  contractions every 5 minutes or less.  Your water breaks. Summary  Preterm labor is labor that starts before you reach 37 weeks of pregnancy.  Delivering your baby early increases your baby's risk of developing lifelong problems.  The exact cause of preterm labor is unknown. However, having an abnormal uterus, an STI (sexually transmitted infection), or vaginal bleeding during pregnancy increases your risk for preterm labor.  Keep all follow-up visits as told by your health care provider. This is important.  Contact a health care provider if you have signs or symptoms of preterm labor. This information is not intended to replace advice given to you by your health care provider. Make sure you discuss any questions you have with your health care provider. Document Revised: 10/13/2019 Document Reviewed: 10/13/2019 Elsevier Patient Education  2021 [employer].        Contraception Choices Contraception, also called birth control, refers to methods or devices that prevent pregnancy. Hormonal methods Contraceptive implant A contraceptive implant is a thin, plastic tube that contains a hormone that prevents pregnancy. It is different from an intrauterine device (IUD). It is inserted into the upper part of the arm by a health care provider. Implants can be effective for up to 3 years. Progestin-only injections Progestin-only injections are injections of progestin, a  synthetic form of the hormone progesterone. They are given every 3 months by a health care provider. Birth control pills Birth control pills are pills that contain hormones that prevent pregnancy. They must be taken once a day, preferably at the same time each day. A prescription is needed to use this method of contraception. Birth control patch The birth control patch contains hormones that prevent pregnancy. It is placed on the skin and must be changed once a week for three weeks and removed on the fourth week. A  prescription is needed to use this method of contraception. Vaginal ring A vaginal ring contains hormones that prevent pregnancy. It is placed in the vagina for three weeks and removed on the fourth week. After that, the process is repeated with a new ring. A prescription is needed to use this method of contraception. Emergency contraceptive Emergency contraceptives prevent pregnancy after unprotected sex. They come in pill form and can be taken up to 5 days after sex. They work best the sooner they are taken after having sex. Most emergency contraceptives are available without a prescription. This method should not be used as your only form of birth control.   Barrier methods Female condom A female condom is a thin sheath that is worn over the penis during sex. Condoms keep sperm from going inside a woman's body. They can be used with a sperm-killing substance (spermicide) to increase their effectiveness. They should be thrown away after one use. Female condom A female condom is a soft, loose-fitting sheath that is put into the vagina before sex. The condom keeps sperm from going inside a woman's body. They should be thrown away after one use. Diaphragm A diaphragm is a soft, dome-shaped barrier. It is inserted into the vagina before sex, along with a spermicide. The diaphragm blocks sperm from entering the uterus, and the spermicide kills sperm. A diaphragm should be left in the vagina for 6-8 hours after sex and removed within 24 hours. A diaphragm is prescribed and fitted by a health care provider. A diaphragm should be replaced every 1-2 years, after giving birth, after gaining more than 15 lb (6.8 kg), and after pelvic surgery. Cervical cap A cervical cap is a round, soft latex or plastic cup that fits over the cervix. It is inserted into the vagina before sex, along with spermicide. It blocks sperm from entering the uterus. The cap should be left in place for 6-8 hours after sex and removed within  48 hours. A cervical cap must be prescribed and fitted by a health care provider. It should be replaced every 2 years. Sponge A sponge is a soft, circular piece of polyurethane foam with spermicide in it. The sponge helps block sperm from entering the uterus, and the spermicide kills sperm. To use it, you make it wet and then insert it into the vagina. It should be inserted before sex, left in for at least 6 hours after sex, and removed and thrown away within 30 hours. Spermicides Spermicides are chemicals that kill or block sperm from entering the cervix and uterus. They can come as a cream, jelly, suppository, foam, or tablet. A spermicide should be inserted into the vagina with an applicator at least 10-15 minutes before sex to allow time for it to work. The process must be repeated every time you have sex. Spermicides do not require a prescription.   Intrauterine contraception Intrauterine device (IUD) An IUD is a T-shaped device that is put in a woman's uterus. There  are two types:  Hormone IUD.This type contains progestin, a synthetic form of the hormone progesterone. This type can stay in place for 3-5 years.  Copper IUD.This type is wrapped in copper wire. It can stay in place for 10 years. Permanent methods of contraception Female tubal ligation In this method, a woman's fallopian tubes are sealed, tied, or blocked during surgery to prevent eggs from traveling to the uterus. Hysteroscopic sterilization In this method, a small, flexible insert is placed into each fallopian tube. The inserts cause scar tissue to form in the fallopian tubes and block them, so sperm cannot reach an egg. The procedure takes about 3 months to be effective. Another form of birth control must be used during those 3 months. Female sterilization This is a procedure to tie off the tubes that carry sperm (vasectomy). After the procedure, the man can still ejaculate fluid (semen). Another form of birth control must be  used for 3 months after the procedure. Natural planning methods Natural family planning In this method, a couple does not have sex on days when the woman could become pregnant. Calendar method In this method, the woman keeps track of the length of each menstrual cycle, identifies the days when pregnancy can happen, and does not have sex on those days. Ovulation method In this method, a couple avoids sex during ovulation. Symptothermal method This method involves not having sex during ovulation. The woman typically checks for ovulation by watching changes in her temperature and in the consistency of cervical mucus. Post-ovulation method In this method, a couple waits to have sex until after ovulation. Where to find more information  Centers for Disease Control and Prevention: [employer] Summary  Contraception, also called birth control, refers to methods or devices that prevent pregnancy.  Hormonal methods of contraception include implants, injections, pills, patches, vaginal rings, and emergency contraceptives.  Barrier methods of contraception can include female condoms, female condoms, diaphragms, cervical caps, sponges, and spermicides.  There are two types of IUDs (intrauterine devices). An IUD can be put in a woman's uterus to prevent pregnancy for 3-5 years.  Permanent sterilization can be done through a procedure for males and females. Natural family planning methods involve nothaving sex on days when the woman could become pregnant. This information is not intended to replace advice given to you by your health care provider. Make sure you discuss any questions you have with your health care provider. Document Revised: 02/15/2020 Document Reviewed: 02/15/2020 Elsevier Patient Education  2021 Elsevier Inc.        Postpartum Tubal Ligation Postpartum tubal ligation (PPTL) is a procedure to close the fallopian tubes. This is done to prevent pregnancy. When the fallopian tubes  are closed, the eggs that the ovaries release cannot enter the uterus, and sperm cannot reach the eggs. PPTL is done right after childbirth or 1-2 days after childbirth, before the uterus returns to its normal position. If you have a cesarean section, it can be performed at the same time as the procedure. Having this done after childbirth does not make your stay in the hospital longer. PPTL is sometimes called "getting your tubes tied." You should not have this procedure if you want to get pregnant again or if you are unsure about having more children. Tell a health care provider about:  Any allergies you have.  All medicines you are taking, including vitamins, herbs, eye drops, creams, and over-the-counter medicines.  Any problems you or family members have had with anesthetic medicines.  Any  blood disorders you have.  Any surgeries you have had.  Any medical conditions you have or have had.  Any past pregnancies. What are the risks? Generally, this is a safe procedure. However, problems may occur, including:  Infection.  Bleeding.  Damage to other organs in the abdomen.  Side effects from anesthetic medicines.  Failure of the procedure. If this happens, you could get pregnant.  Ectopic pregnancy. This is a pregnancy in which the egg attaches outside the uterus. What happens before the procedure? Ask your health care provider about:  How much pain you can expect to have.  What medicines you will be given for pain, especially if you are breastfeeding. What happens during the procedure? If you had a vaginal delivery:  An IV will be inserted into one of your veins.  You will be given one or more of the following: ? A medicine to help you relax (sedative). ? A medicine to numb the area (local anesthetic). ? A medicine to make you fall asleep (general anesthetic). ? A medicine that is injected into an area of your body to numb everything below the injection site (regional  anesthetic).  If you have been given a general anesthetic, a tube will be put down your throat to help you breathe.  Your bladder may be emptied with a small tube (catheter).  An incision will be made just below your belly button.  Your fallopian tubes will be located and brought up through the incision.  Your fallopian tubes will be tied off, burned (cauterized), or blocked with a clip, ring, or clamp. A small part in the center of each fallopian tube may be removed.  The incision will be closed with stitches (sutures).  A bandage (dressing) will be placed over the incision. If you had a cesarean delivery:  Tubal ligation will be done through the incision that was used for the cesarean delivery of your baby.  The incision will be closed with sutures.  A dressing will be placed over the incision. The procedure may vary among health care providers and hospitals.   What happens after the procedure?  Your blood pressure, heart rate, breathing rate, and blood oxygen level will be monitored until you leave the hospital.  You will be given pain medicine as needed.  You will be encouraged to get up early and walk to prevent blood clots.  If you were given a sedative during the procedure, it can affect you for several hours. Do not drive or operate machinery until your health care provider says that it is safe. Summary  Postpartum tubal ligation is a procedure that closes the fallopian tubes to prevent pregnancy.  This procedure is done while you are still in the hospital after childbirth. If you have a cesarean section, it can be performed at the same time.  Having this done after childbirth does not make your stay in the hospital longer.  Postpartum tubal ligation is considered permanent. You should not have this procedure if you want to get pregnant again or if you are unsure about having more children.  Talk to your health care provider to see if this procedure is right for  you. This information is not intended to replace advice given to you by your health care provider. Make sure you discuss any questions you have with your health care provider. Document Revised: 05/27/2020 Document Reviewed: 05/27/2020 Elsevier Patient Education  2021 Elsevier Inc.        Group B Streptococcus Test During  Pregnancy Why am I having this test? Routine testing, also called screening, for group B streptococcus (GBS) is recommended for all pregnant women between the 36th and 37th week of pregnancy. GBS is a type of bacteria that can be passed from mother to baby during childbirth. Screening will help guide whether or not you will need treatment during labor and delivery to prevent complications such as:  An infection in your uterus during labor.  An infection in your uterus after delivery.  A serious infection in your baby after delivery, such as pneumonia, meningitis, or sepsis. GBS screening is not often done before 36 weeks of pregnancy unless you go into labor prematurely. What happens if I have group B streptococcus? If testing shows that you have GBS, your health care provider will recommend treatment with IV antibiotics during labor and delivery. This treatment significantly decreases the risk of complications for you and your baby. If you have a planned C-section and you have GBS, you may not need to be treated with antibiotics because GBS is usually passed to babies after labor starts and your water breaks. If you are in labor or your water breaks before your C-section, it is possible for GBS to get into your uterus and be passed to your baby, so you might need treatment. Is there a chance I may not need to be tested? You may not need to be tested for GBS if:  You have a urine test that shows GBS before 36 to 37 weeks.  You had a baby with GBS infection after a previous delivery. In these cases, you will automatically be treated for GBS during labor and  delivery. What is being tested? This test is done to check if you have group B streptococcus in your vagina or rectum. What kind of sample is taken? To collect samples for this test, your health care provider will swab your vagina and rectum with a cotton swab. The sample is then sent to the lab to see if GBS is present. What happens during the test?  You will remove your clothing from the waist down.  You will lie down on an exam table in the same position as you would for a pelvic exam.  Your health care provider will swab your vagina and rectum to collect samples for a culture test.  You will be able to go home after the test and do all your usual activities.   How are the results reported? The test results are reported as positive or negative. What do the results mean?  A positive test means you are at risk for passing GBS to your baby during labor and delivery. Your health care provider will recommend that you are treated with an IV antibiotic during labor and delivery.  A negative test means you are at very low risk of passing GBS to your baby. There is still a low risk of passing GBS to your baby because sometimes test results may report that you do not have a condition when you do (false-negative result) or there is a chance that you may become infected with GBS after the test is done. You most likely will not need to be treated with an antibiotic during labor and delivery. Talk with your health care provider about what your results mean. Questions to ask your health care provider Ask your health care provider, or the department that is doing the test:  When will my results be ready?  How will I get my results?  What are my treatment options? Summary  Routine testing (screening) for group B streptococcus (GBS) is recommended for all pregnant women between the 36th and 37th week of pregnancy.  GBS is a type of bacteria that can be passed from mother to baby during  childbirth.  If testing shows that you have GBS, your health care provider will recommend that you are treated with IV antibiotics during labor and delivery. This treatment almost always prevents infection in newborns. This information is not intended to replace advice given to you by your health care provider. Make sure you discuss any questions you have with your health care provider. Document Revised: 07/12/2020 Document Reviewed: 10/08/2018 Elsevier Patient Education  2021 [employer]

## 2020-10-05 NOTE — Progress Notes (Signed)
I connected with  Nan Study on 10/05/20 by a video enabled telemedicine application and verified that I am speaking with the correct person using two identifiers.   I discussed the limitations of evaluation and management by telemedicine. The patient expressed understanding and agreed to proceed.  PATIENT:  DR. APPT PROVIDER: [facility], reports no problems today.

## 2020-10-12 ENCOUNTER — Other Ambulatory Visit: Payer: Self-pay

## 2020-10-12 ENCOUNTER — Ambulatory Visit: Payer: Medicaid Other | Admitting: *Deleted

## 2020-10-12 ENCOUNTER — Ambulatory Visit: Payer: Medicaid Other | Attending: Obstetrics

## 2020-10-12 ENCOUNTER — Encounter: Payer: Self-pay | Admitting: *Deleted

## 2020-10-12 DIAGNOSIS — O09893 Supervision of other high risk pregnancies, third trimester: Secondary | ICD-10-CM | POA: Diagnosis not present

## 2020-10-12 DIAGNOSIS — O99320 Drug use complicating pregnancy, unspecified trimester: Secondary | ICD-10-CM | POA: Insufficient documentation

## 2020-10-12 DIAGNOSIS — O09299 Supervision of pregnancy with other poor reproductive or obstetric history, unspecified trimester: Secondary | ICD-10-CM | POA: Diagnosis present

## 2020-10-12 DIAGNOSIS — O0990 Supervision of high risk pregnancy, unspecified, unspecified trimester: Secondary | ICD-10-CM | POA: Insufficient documentation

## 2020-10-12 DIAGNOSIS — F1990 Other psychoactive substance use, unspecified, uncomplicated: Secondary | ICD-10-CM | POA: Diagnosis not present

## 2020-10-12 DIAGNOSIS — O09523 Supervision of elderly multigravida, third trimester: Secondary | ICD-10-CM

## 2020-10-12 DIAGNOSIS — Z362 Encounter for other antenatal screening follow-up: Secondary | ICD-10-CM

## 2020-10-12 DIAGNOSIS — Z3A34 34 weeks gestation of pregnancy: Secondary | ICD-10-CM

## 2020-10-12 DIAGNOSIS — F1120 Opioid dependence, uncomplicated: Secondary | ICD-10-CM | POA: Diagnosis present

## 2020-10-12 IMAGING — US US OB TRANSVAGINAL
1 series · 15 of 25 positions shown · non-contrast
Comparison: 11/26/2019

CLINICAL DATA: Bleeding, pregnant, assess fetal viability

EXAM:
OBSTETRIC <14 WK US AND TRANSVAGINAL OB US
TECHNIQUE: Both transabdominal and transvaginal ultrasound examinations were
performed for complete evaluation of the gestation as well as the
maternal uterus, adnexal regions, and pelvic cul-de-sac.
Transvaginal technique was performed to assess early pregnancy.

[Series 1: us ob transvaginal · 15 of 25 slices shown]
[im 1/25]
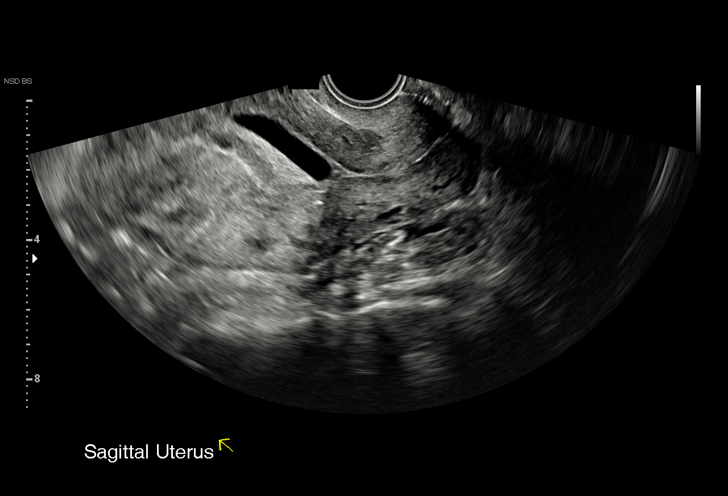
[im 3/25]
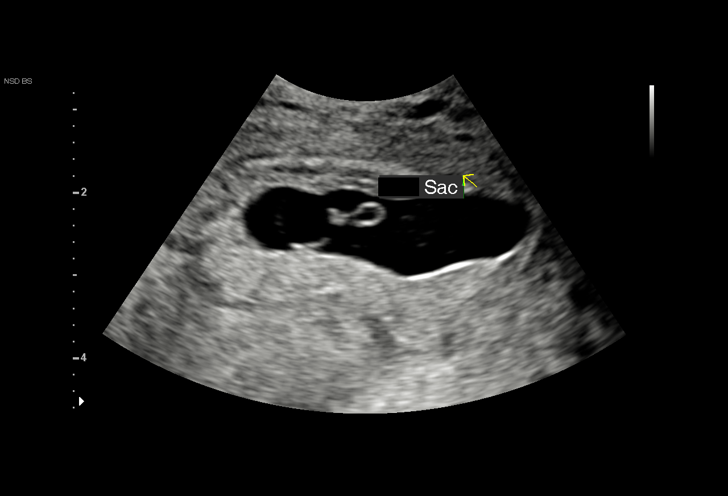
[im 5/25]
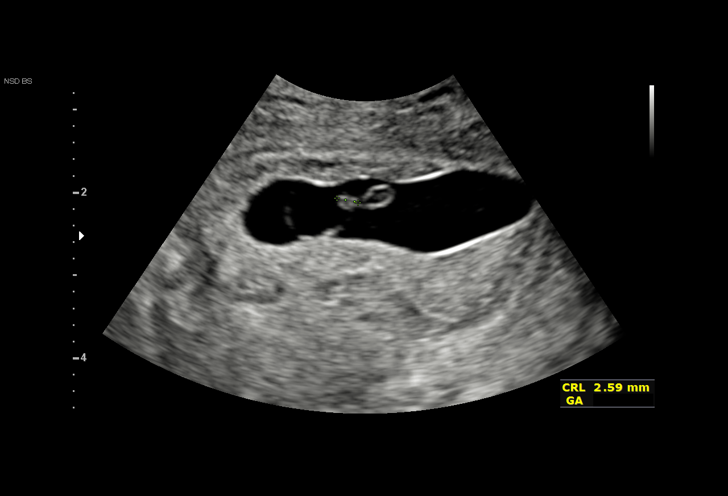
[im 6/25]
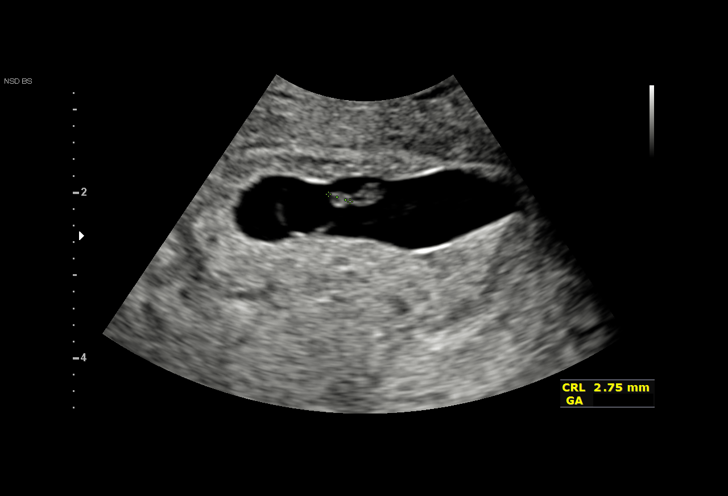
[im 8/25]
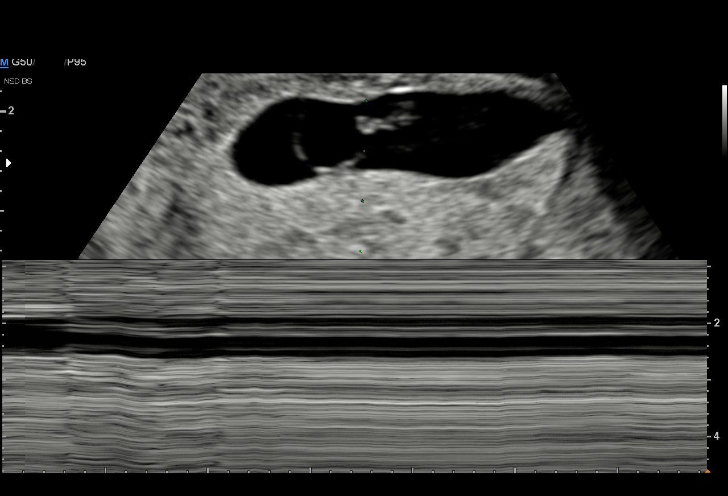
[im 10/25]
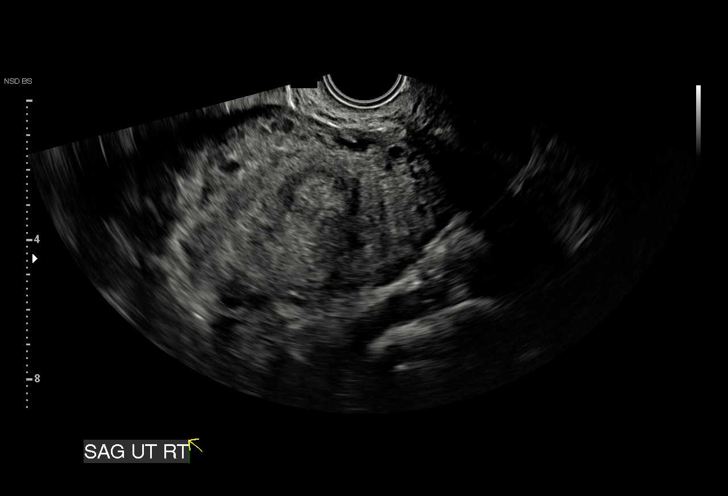
[im 11/25]
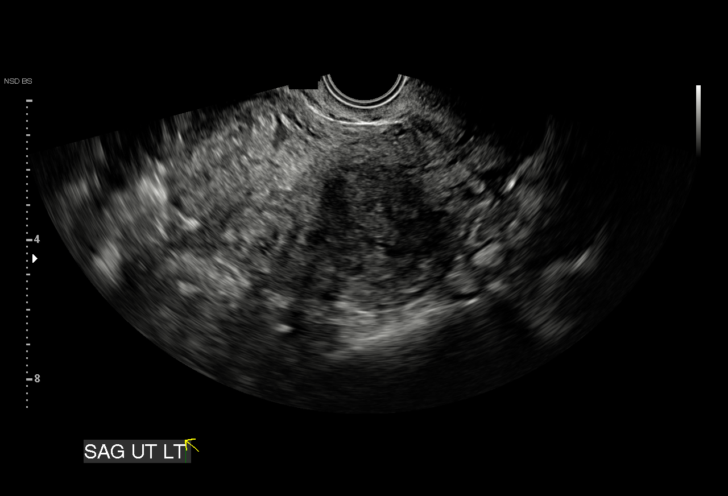
[im 13/25]
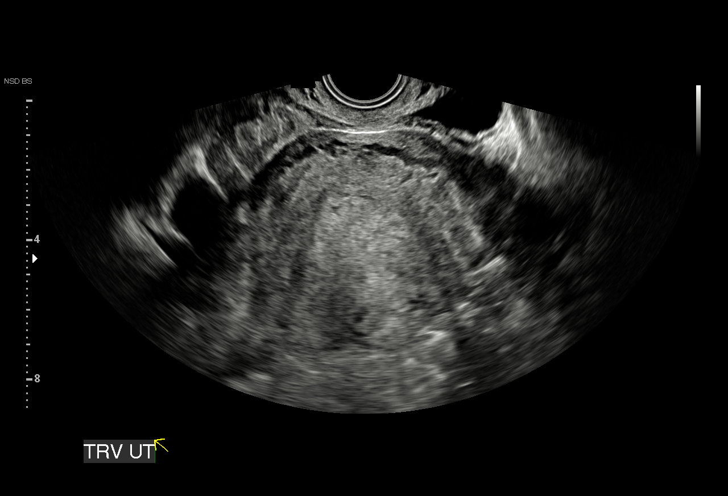
[im 15/25]
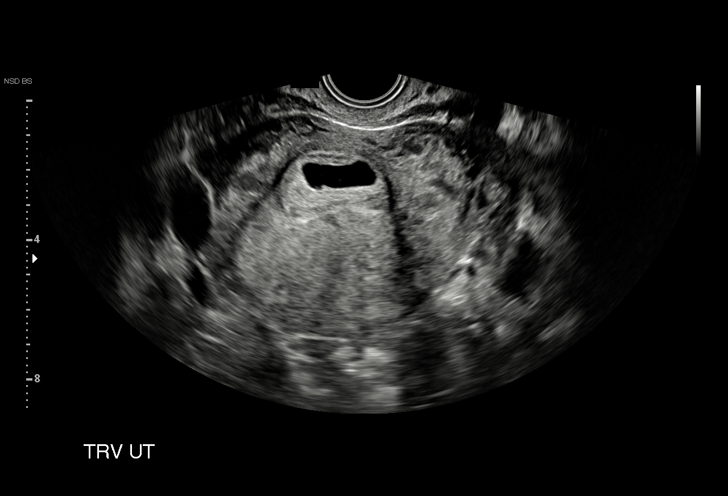
[im 16/25]
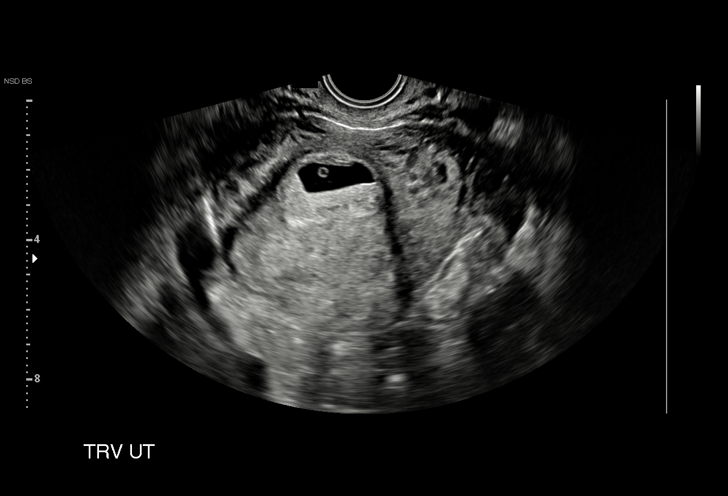
[im 18/25]
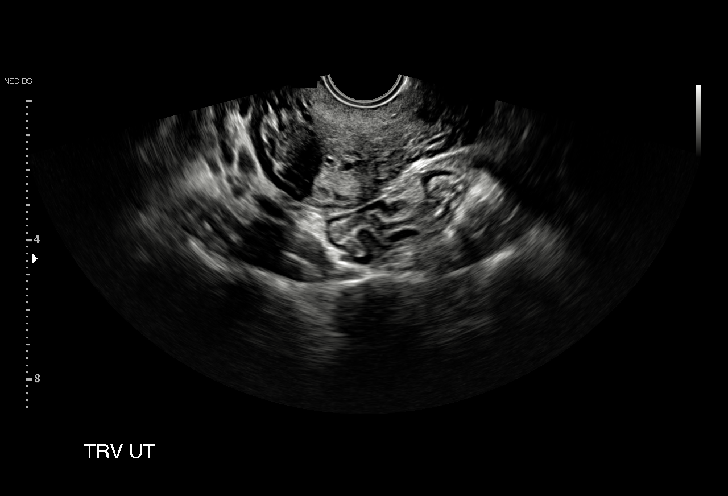
[im 20/25]
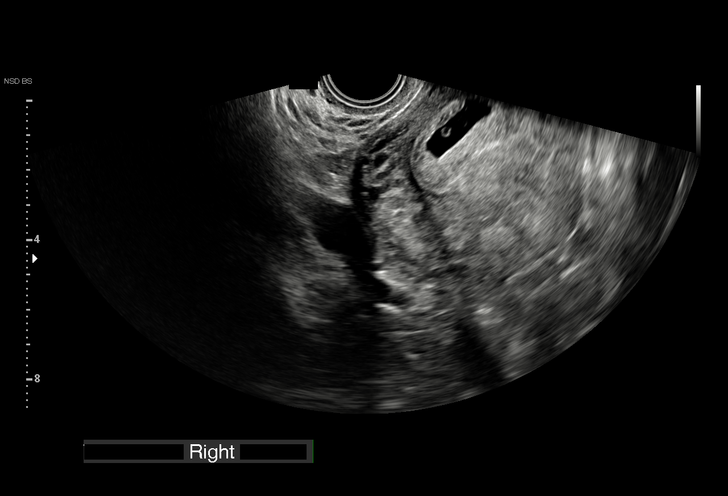
[im 21/25]
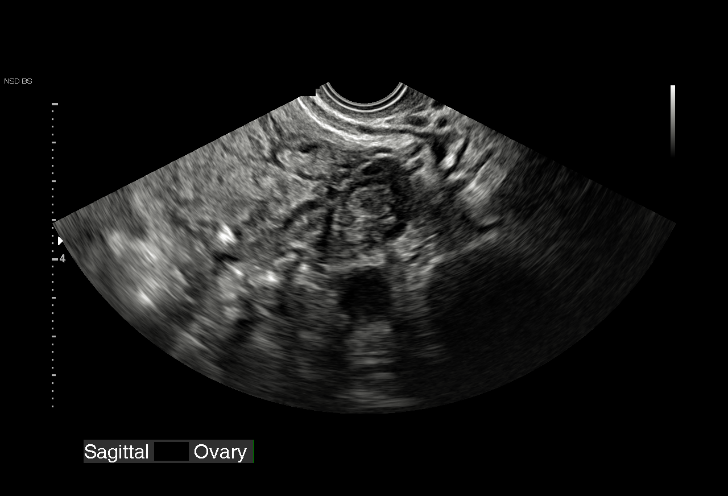
[im 23/25]
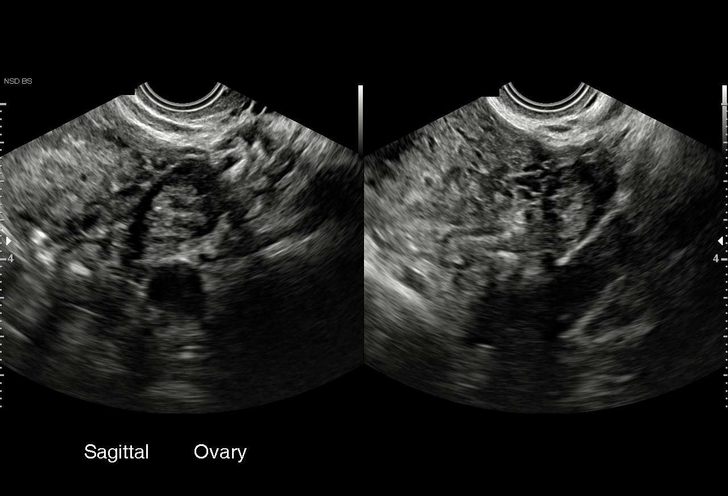
[im 25/25]
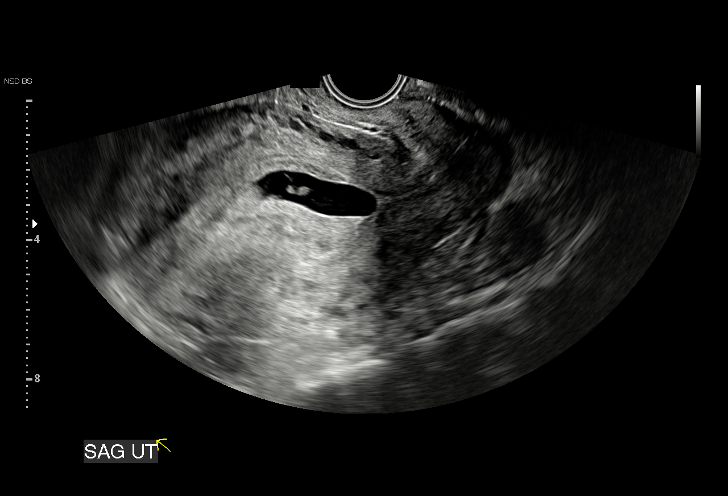

[15 of 25 positions shown; findings below may reference images not displayed]

FINDINGS: Intrauterine gestational sac: Single

Yolk sac:  Visualized.

Embryo:  Visualized.

Cardiac Activity: Not Visualized.

CRL:  2.8 mm   5 w   5 d

Subchorionic hemorrhage:  None visualized.

Maternal uterus/adnexae: Right ovary is not visualized. Uterus and
left ovary are unremarkable.
IMPRESSION: 1. No significant change to the appearance of the gestational sac
and intrauterine pregnancy since prior study. The lack of cardiac
activity or interval growth is compatible with a missed abortion.

## 2020-10-13 ENCOUNTER — Other Ambulatory Visit: Payer: Self-pay | Admitting: *Deleted

## 2020-10-13 DIAGNOSIS — F1190 Opioid use, unspecified, uncomplicated: Secondary | ICD-10-CM

## 2020-10-24 ENCOUNTER — Encounter (HOSPITAL_COMMUNITY): Payer: Self-pay | Admitting: Obstetrics and Gynecology

## 2020-10-24 ENCOUNTER — Inpatient Hospital Stay (HOSPITAL_COMMUNITY)
Admission: AD | Admit: 2020-10-24 | Discharge: 2020-10-25 | Disposition: A | Discharge status: Home / Self Care | Payer: Medicaid Other | Attending: Obstetrics and Gynecology | Admitting: Obstetrics and Gynecology

## 2020-10-24 ENCOUNTER — Other Ambulatory Visit: Payer: Self-pay

## 2020-10-24 DIAGNOSIS — Z3A36 36 weeks gestation of pregnancy: Secondary | ICD-10-CM | POA: Insufficient documentation

## 2020-10-24 DIAGNOSIS — O26893 Other specified pregnancy related conditions, third trimester: Secondary | ICD-10-CM | POA: Diagnosis not present

## 2020-10-24 DIAGNOSIS — R102 Pelvic and perineal pain: Secondary | ICD-10-CM | POA: Diagnosis not present

## 2020-10-24 DIAGNOSIS — G44209 Tension-type headache, unspecified, not intractable: Secondary | ICD-10-CM

## 2020-10-24 DIAGNOSIS — F17210 Nicotine dependence, cigarettes, uncomplicated: Secondary | ICD-10-CM | POA: Insufficient documentation

## 2020-10-24 DIAGNOSIS — Z3689 Encounter for other specified antenatal screening: Secondary | ICD-10-CM | POA: Diagnosis not present

## 2020-10-24 DIAGNOSIS — Z885 Allergy status to narcotic agent status: Secondary | ICD-10-CM | POA: Diagnosis not present

## 2020-10-24 DIAGNOSIS — Z79899 Other long term (current) drug therapy: Secondary | ICD-10-CM | POA: Diagnosis not present

## 2020-10-24 DIAGNOSIS — R519 Headache, unspecified: Secondary | ICD-10-CM | POA: Insufficient documentation

## 2020-10-24 DIAGNOSIS — O99333 Smoking (tobacco) complicating pregnancy, third trimester: Secondary | ICD-10-CM | POA: Insufficient documentation

## 2020-10-24 LAB — URINALYSIS, ROUTINE W REFLEX MICROSCOPIC
Bacteria, UA: NONE SEEN
Bilirubin Urine: NEGATIVE
Glucose, UA: NEGATIVE mg/dL
Hgb urine dipstick: NEGATIVE
Ketones, ur: NEGATIVE mg/dL
Nitrite: NEGATIVE
Protein, ur: NEGATIVE mg/dL
Specific Gravity, Urine: 1.012 (ref 1.005–1.030)
pH: 7 (ref 5.0–8.0)

## 2020-10-24 LAB — CBC
HCT: 32.2 % — ABNORMAL LOW (ref 36.0–46.0)
Hemoglobin: 10.9 g/dL — ABNORMAL LOW (ref 12.0–15.0)
MCH: 30.2 pg (ref 26.0–34.0)
MCHC: 33.9 g/dL (ref 30.0–36.0)
MCV: 89.2 fL (ref 80.0–100.0)
Platelets: 215 10*3/uL (ref 150–400)
RBC: 3.61 MIL/uL — ABNORMAL LOW (ref 3.87–5.11)
RDW: 14.6 % (ref 11.5–15.5)
WBC: 8.8 10*3/uL (ref 4.0–10.5)
nRBC: 0 % (ref 0.0–0.2)

## 2020-10-24 MED ORDER — ACETAMINOPHEN 500 MG PO TABS
1000.0000 mg | ORAL_TABLET | Freq: Once | ORAL | Status: AC
  Administered 2020-10-24: 1000 mg via ORAL
  Filled 2020-10-24: qty 2

## 2020-10-24 MED ORDER — CYCLOBENZAPRINE HCL 5 MG PO TABS
10.0000 mg | ORAL_TABLET | Freq: Once | ORAL | Status: AC
  Administered 2020-10-24: 10 mg via ORAL
  Filled 2020-10-24: qty 2

## 2020-10-24 NOTE — MAU Note (Signed)
..  Molly Thornton is a 35 y.o. at [redacted]w[redacted]d here in MAU reporting: Vaginal pain, elevated blood pressure, swelling, and headache. Patient reports vaginal pain for two weeks that is worse when she has been laying for a long time and gets up, the pain is aching. She took her blood pressure at home and it was 138/72. Reports a headache that began around 4am and hazzy vision that began yesterday. Denies epigastric pain. Her swelling began two weeks ago and it decreased when she elevated them and is now less than before. Denies vaginal bleeding, contractions, or leaking of fluid. Patient reports +FM but less in the last few weeks.  She reports calling the nurse line and was told to come into MAU.   Pain score: 10/10 vaginal pain. Headache 6/10 Vitals:   10/24/20 2140  BP: 133/82  Pulse: (!) 104  Resp: 17  Temp: 98.5 F (36.9 C)  SpO2: 100%     FHT: 142 Lab orders placed from triage: UA

## 2020-10-25 DIAGNOSIS — O26893 Other specified pregnancy related conditions, third trimester: Secondary | ICD-10-CM

## 2020-10-25 DIAGNOSIS — Z3A36 36 weeks gestation of pregnancy: Secondary | ICD-10-CM

## 2020-10-25 DIAGNOSIS — R102 Pelvic and perineal pain: Secondary | ICD-10-CM

## 2020-10-25 DIAGNOSIS — Z3689 Encounter for other specified antenatal screening: Secondary | ICD-10-CM

## 2020-10-25 LAB — COMPREHENSIVE METABOLIC PANEL
ALT: 14 U/L (ref 0–44)
AST: 22 U/L (ref 15–41)
Albumin: 2.5 g/dL — ABNORMAL LOW (ref 3.5–5.0)
Alkaline Phosphatase: 60 U/L (ref 38–126)
Anion gap: 8 (ref 5–15)
BUN: 14 mg/dL (ref 6–20)
CO2: 24 mmol/L (ref 22–32)
Calcium: 9.2 mg/dL (ref 8.9–10.3)
Chloride: 104 mmol/L (ref 98–111)
Creatinine, Ser: 0.98 mg/dL (ref 0.44–1.00)
GFR, Estimated: >60 mL/min (ref >60)
Glucose, Bld: 110 mg/dL — ABNORMAL HIGH (ref 70–99)
Potassium: 3.6 mmol/L (ref 3.5–5.1)
Sodium: 136 mmol/L (ref 135–145)
Total Bilirubin: 0.7 mg/dL (ref 0.3–1.2)
Total Protein: 6.1 g/dL — ABNORMAL LOW (ref 6.5–8.1)

## 2020-10-25 MED ORDER — CYCLOBENZAPRINE HCL 10 MG PO TABS: 10.0000 mg | ORAL_TABLET | Freq: Three times a day (TID) | ORAL | 1 refills | Status: DC | PRN

## 2020-10-25 NOTE — MAU Provider Note (Signed)
Chief Complaint:  Headache and Pelvic Pain   Event Date/Time   First Provider Initiated Contact with Patient 10/24/20 2209     HPI: Molly Thornton is a 35 y.o. [id number] at 1w3dwho presents to maternity admissions reporting elevated blood pressure reading at home (138/68), headache with "hazy vision", dependent edema that gets better with elevation and vaginal pain and pressure that is aggravated by movement. 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 Denies vaginal bleeding, leaking of fluid, decreased fetal movement, fever, falls, or recent illness.   Pregnancy Course: Receives care at [facility]  Past Medical History:  Diagnosis Date  . Anxiety   . [place] disorder ([facility]   . Depression   . Hepatitis C    "never tx'd" (05/02/2018)  . Migraine    "daily" (05/02/2018)  . PTSD (post-traumatic stress disorder)   . [place] ([place]    OB History  Gravida Para Term Preterm AB Living  _0 SAB IAB Ectopic Multiple Live Births  _1 # Outcome Date GA Lbr Len/2nd Weight Sex Delivery Anes PTL Lv  5 Current           4 SAB 11/2019          3 IAB 04/02/09          2 Term 01/25/09    M Vag-Spont   LIV  1 Term 09/04/07    F Vag-Spont   LIV   Past Surgical History:  Procedure Laterality Date  . WISDOM TOOTH EXTRACTION     Family History  Problem Relation Age of Onset  . Heart disease Father   . Hypertension Father   . Asthma Brother   . Diabetes Mother   . Pancreatitis Mother   . Coronary artery disease Mother   . Neuropathy Mother    Social History   Tobacco Use  . Smoking status: Current Every Day Smoker    Packs/day: 0.50    Years: 17.00    Pack years: 8.50    Types: Cigarettes  . Smokeless tobacco: Never Used  Vaping Use  . Vaping Use: Never used  Substance Use Topics  . Alcohol use: Not Currently    Comment: 05/02/2018 "nothing since 2013"  . Drug use: Yes    Frequency: 1.0  times per week    Types: Marijuana, Oxycodone    Comment: "not currently" for heroin/oxy per pt- marijuana last use 04/25/20   Allergies  Allergen Reactions  . Hydrocodone Hives  . Ivp Dye [Iodinated Diagnostic Agents] Rash   Medications Prior to Admission  Medication Sig Dispense Refill Last Dose  . Blood Pressure Monitoring (BLOOD PRESSURE KIT) DEVI 1 kit by Does not apply route once a week. 1 each 0 10/24/2020 at Unknown time  . methadone (DOLOPHINE) 10 MG/ML solution Take 160 mg by mouth every 8 (eight) hours.   10/24/2020 at Unknown time  . ALBUTEROL IN Inhale into the lungs.   More than a month at Unknown time  . buPROPion (WELLBUTRIN XL) 150 MG 24 hr tablet Take 1 tab po daily for 3 days, then increase to 1 tab po bid for 7-12 weeks. (Patient not taking: No sig reported) 60 tablet 2   . Doxylamine-Pyridoxine (DICLEGIS) 10-10 MG TBEC Take 2 tabs qhs then add 1 tab A.M and midday prn (Patient not taking: No sig reported) 90 tablet 6   .  Elastic Bandages & Supports (COMFORT FIT MATERNITY SUPP MED) MISC 1 Device by Does not apply route daily as needed. 1 each 0   . metroNIDAZOLE (FLAGYL) 500 MG tablet Take 1 tablet (500 mg total) by mouth 2 (two) times daily. (Patient not taking: No sig reported) 14 tablet 0   . polyethylene glycol (MIRALAX / GLYCOLAX) 17 g packet Take 17 g by mouth 2 (two) times daily. (Patient not taking: No sig reported) 60 each 1   . Prenat-Fe Poly-Methfol-FA-DHA (VITAFOL ULTRA) 29-0.6-0.4-200 MG CAPS Take 1 capsule by mouth daily before breakfast. 90 capsule 4   . promethazine (PHENERGAN) 25 MG tablet Take 1 tablet (25 mg total) by mouth every 6 (six) hours as needed for nausea or vomiting. (Patient not taking: No sig reported) 60 tablet 0   . terconazole (TERAZOL 3) 0.8 % vaginal cream Place 1 applicator vaginally at bedtime. Apply nightly for three nights. (Patient not taking: No sig reported) 20 g 0    I have reviewed patient's Past Medical Hx, Surgical Hx, Family Hx,  Social Hx, medications and allergies.   ROS:  Review of Systems  Constitutional: Negative for chills, fatigue and fever.  HENT: Negative for congestion and sore throat.   Eyes: Positive for visual disturbance. Negative for photophobia.  Respiratory: Negative for shortness of breath.   Cardiovascular: Negative for chest pain.  Gastrointestinal: Negative for diarrhea, nausea and vomiting.  Genitourinary: Positive for pelvic pain and vaginal pain. Negative for vaginal bleeding and vaginal discharge.  Musculoskeletal: Negative for back pain.  Neurological: Positive for headaches. Negative for dizziness, syncope and light-headedness.   Physical Exam   Patient Vitals for the past 24 hrs:  BP Temp Temp src Pulse Resp SpO2 Height Weight  10/24/20 2316 -- -- -- -- -- 97 % -- --  10/24/20 2245 -- -- -- -- -- 98 % -- --  10/24/20 2220 -- -- -- -- -- 96 % -- --  10/24/20 2200 116/81 -- -- 89 -- -- -- --  10/24/20 2140 133/82 98.5 F (36.9 C) Oral (!) 104 17 100 % _0  (1.702 m) 179 lb 3.2 oz (81.3 kg)   Constitutional: Well-developed, well-nourished female in no acute distress.  Cardiovascular: normal rate & rhythm, no murmur Respiratory: normal effort, lung sounds clear throughout GI: Abd soft, non-tender, gravid appropriate for gestational age. Pos BS x 4 MS: Extremities nontender, no edema, normal ROM Neurologic: Alert and oriented x 4.  GU: no CVA tenderness Pelvic: NEFG, physiologic discharge, no blood, cervix clean.      Fetal Tracing: reactive Baseline: 135 Variability: moderate Accelerations: present Decelerations: none Toco: UI   Labs: Results for orders placed or performed during the hospital encounter of 10/24/20 (from the past 24 hour(s))  Urinalysis, Routine w reflex microscopic Urine, Clean Catch     Status: Abnormal   Collection Time: 10/24/20  9:46 PM  Result Value Ref Range   Color, Urine YELLOW YELLOW   APPearance HAZY (A) CLEAR   Specific Gravity, Urine 1.012  1.005 - 1.030   pH 7.0 5.0 - 8.0   Glucose, UA NEGATIVE NEGATIVE mg/dL   Hgb urine dipstick NEGATIVE NEGATIVE   Bilirubin Urine NEGATIVE NEGATIVE   Ketones, ur NEGATIVE NEGATIVE mg/dL   Protein, ur NEGATIVE NEGATIVE mg/dL   Nitrite NEGATIVE NEGATIVE   Leukocytes,Ua TRACE (A) NEGATIVE   RBC / HPF 0-5 0 - 5 RBC/hpf   WBC, UA 0-5 0 - 5 WBC/hpf   Bacteria, UA NONE SEEN NONE SEEN  Squamous Epithelial / LPF 0-5 0 - 5   Amorphous Crystal PRESENT   CBC     Status: Abnormal   Collection Time: 10/24/20 10:50 PM  Result Value Ref Range   WBC 8.8 4.0 - 10.5 K/uL   RBC 3.61 (L) 3.87 - 5.11 MIL/uL   Hemoglobin 10.9 (L) 12.0 - 15.0 g/dL   HCT 47.0 (L) 92.9 - 57.4 %   MCV 89.2 80.0 - 100.0 fL   MCH 30.2 26.0 - 34.0 pg   MCHC 33.9 30.0 - 36.0 g/dL   RDW 73.4 03.7 - 09.6 %   Platelets 215 150 - 400 K/uL   nRBC 0.0 0.0 - 0.2 %  Comprehensive metabolic panel     Status: Abnormal   Collection Time: 10/24/20 10:50 PM  Result Value Ref Range   Sodium 136 135 - 145 mmol/L   Potassium 3.6 3.5 - 5.1 mmol/L   Chloride 104 98 - 111 mmol/L   CO2 24 22 - 32 mmol/L   Glucose, Bld 110 (H) 70 - 99 mg/dL   BUN 14 6 - 20 mg/dL   Creatinine, Ser 4.38 0.44 - 1.00 mg/dL   Calcium 9.2 8.9 - 38.1 mg/dL   Total Protein 6.1 (L) 6.5 - 8.1 g/dL   Albumin 2.5 (L) 3.5 - 5.0 g/dL   AST 22 15 - 41 U/L   ALT 14 0 - 44 U/L   Alkaline Phosphatase 60 38 - 126 U/L   Total Bilirubin 0.7 0.3 - 1.2 mg/dL   GFR, Estimated >84 >03 mL/min   Anion gap 8 5 - 15   Imaging:  No results found.  MAU Course: Orders Placed This Encounter  Procedures  . Urinalysis, Routine w reflex microscopic Urine, Clean Catch  . CBC  . Comprehensive metabolic panel  . Protein / creatinine ratio, urine  . Discharge patient   Meds ordered this encounter  Medications  . cyclobenzaprine (FLEXERIL) tablet 10 mg  . acetaminophen (TYLENOL) tablet 1,000 mg   MDM: BP normotensive in MAU, preeclampsia labs ordered given hx of PEC - all  normal  Flexeril and tylenol given for headache and pelvic pain with good relief, pt sleeping when I went in to reassess  Discussed measures to relieve SPD pain, suggest pelvic floor physical therapy prenatally if possible, but certainly postpartum.  Assessment: 1. Pelvic pain affecting pregnancy in third trimester, antepartum   2. NST (non-stress test) reactive    Plan: Discharge home in stable condition with preterm labor precautions.    Follow-up Information    [facility]. Go to.   Specialty: Obstetrics and Gynecology Why: as scheduled for ongoing prenatal care Contact information: [address], [address] [place] [address] [phone]              Allergies as of 10/25/2020      Reactions   Hydrocodone Hives   Ivp Dye [iodinated Diagnostic Agents] Rash      Medication List    STOP taking these medications   buPROPion 150 MG 24 hr tablet Commonly known as: Wellbutrin XL   Doxylamine-Pyridoxine 10-10 MG Tbec Commonly known as: Diclegis   metroNIDAZOLE 500 MG tablet Commonly known as: Flagyl   polyethylene glycol 17 g packet Commonly known as: MIRALAX / GLYCOLAX   promethazine 25 MG tablet Commonly known as: PHENERGAN   terconazole 0.8 % vaginal cream Commonly known as: TERAZOL 3     TAKE these medications   ALBUTEROL IN Inhale  into the lungs.   Blood Pressure Kit Devi 1 kit by Does not apply route once a week.   Comfort Fit Maternity Supp Med Misc 1 Device by Does not apply route daily as needed.   methadone 10 MG/ML solution Commonly known as: DOLOPHINE Take 160 mg by mouth every 8 (eight) hours.   Vitafol Ultra 29-0.6-0.4-200 MG Caps Take 1 capsule by mouth daily before breakfast.      Gaylan Gerold, CNM, MSN, [place] Certified Nurse Midwife, [place]

## 2020-10-31 ENCOUNTER — Encounter: Payer: Medicaid Other | Admitting: Advanced Practice Midwife

## 2020-10-31 ENCOUNTER — Other Ambulatory Visit: Payer: Medicaid Other

## 2020-11-02 ENCOUNTER — Ambulatory Visit (INDEPENDENT_AMBULATORY_CARE_PROVIDER_SITE_OTHER): Payer: Medicaid Other | Admitting: Women's Health

## 2020-11-02 ENCOUNTER — Other Ambulatory Visit: Payer: Medicaid Other

## 2020-11-02 ENCOUNTER — Inpatient Hospital Stay (HOSPITAL_COMMUNITY)
Admission: AD | Admit: 2020-11-02 | Discharge: 2020-11-06 | DRG: 806 | Disposition: A | Discharge status: Home / Self Care | Payer: Medicaid Other | Attending: Obstetrics and Gynecology | Admitting: Obstetrics and Gynecology

## 2020-11-02 ENCOUNTER — Other Ambulatory Visit: Payer: Self-pay | Admitting: Women's Health

## 2020-11-02 ENCOUNTER — Other Ambulatory Visit: Payer: Self-pay

## 2020-11-02 ENCOUNTER — Other Ambulatory Visit (HOSPITAL_COMMUNITY)
Admission: RE | Admit: 2020-11-02 | Discharge: 2020-11-02 | Disposition: A | Discharge status: Home / Self Care | Payer: Medicaid Other | Source: Ambulatory Visit | Attending: Advanced Practice Midwife | Admitting: Advanced Practice Midwife

## 2020-11-02 ENCOUNTER — Encounter (HOSPITAL_COMMUNITY): Payer: Self-pay

## 2020-11-02 VITALS — BP 127/79 | HR 96

## 2020-11-02 DIAGNOSIS — O1413 Severe pre-eclampsia, third trimester: Secondary | ICD-10-CM

## 2020-11-02 DIAGNOSIS — F1290 Cannabis use, unspecified, uncomplicated: Secondary | ICD-10-CM | POA: Diagnosis present

## 2020-11-02 DIAGNOSIS — O24419 Gestational diabetes mellitus in pregnancy, unspecified control: Secondary | ICD-10-CM

## 2020-11-02 DIAGNOSIS — O09299 Supervision of pregnancy with other poor reproductive or obstetric history, unspecified trimester: Secondary | ICD-10-CM

## 2020-11-02 DIAGNOSIS — R519 Headache, unspecified: Secondary | ICD-10-CM

## 2020-11-02 DIAGNOSIS — O9902 Anemia complicating childbirth: Secondary | ICD-10-CM | POA: Diagnosis present

## 2020-11-02 DIAGNOSIS — B182 Chronic viral hepatitis C: Secondary | ICD-10-CM | POA: Diagnosis present

## 2020-11-02 DIAGNOSIS — O99320 Drug use complicating pregnancy, unspecified trimester: Secondary | ICD-10-CM

## 2020-11-02 DIAGNOSIS — Z3A37 37 weeks gestation of pregnancy: Secondary | ICD-10-CM | POA: Diagnosis not present

## 2020-11-02 DIAGNOSIS — O99324 Drug use complicating childbirth: Secondary | ICD-10-CM | POA: Diagnosis present

## 2020-11-02 DIAGNOSIS — O1414 Severe pre-eclampsia complicating childbirth: Secondary | ICD-10-CM | POA: Diagnosis present

## 2020-11-02 DIAGNOSIS — Z30430 Encounter for insertion of intrauterine contraceptive device: Secondary | ICD-10-CM

## 2020-11-02 DIAGNOSIS — F1110 Opioid abuse, uncomplicated: Secondary | ICD-10-CM | POA: Diagnosis present

## 2020-11-02 DIAGNOSIS — O1410 Severe pre-eclampsia, unspecified trimester: Secondary | ICD-10-CM | POA: Diagnosis present

## 2020-11-02 DIAGNOSIS — D573 Sickle-cell trait: Secondary | ICD-10-CM | POA: Diagnosis present

## 2020-11-02 DIAGNOSIS — O26893 Other specified pregnancy related conditions, third trimester: Secondary | ICD-10-CM

## 2020-11-02 DIAGNOSIS — F17210 Nicotine dependence, cigarettes, uncomplicated: Secondary | ICD-10-CM | POA: Diagnosis present

## 2020-11-02 DIAGNOSIS — Z20822 Contact with and (suspected) exposure to covid-19: Secondary | ICD-10-CM | POA: Diagnosis present

## 2020-11-02 DIAGNOSIS — F1410 Cocaine abuse, uncomplicated: Secondary | ICD-10-CM | POA: Diagnosis present

## 2020-11-02 DIAGNOSIS — O99334 Smoking (tobacco) complicating childbirth: Secondary | ICD-10-CM | POA: Diagnosis present

## 2020-11-02 DIAGNOSIS — O0990 Supervision of high risk pregnancy, unspecified, unspecified trimester: Secondary | ICD-10-CM | POA: Diagnosis present

## 2020-11-02 DIAGNOSIS — O24429 Gestational diabetes mellitus in childbirth, unspecified control: Secondary | ICD-10-CM | POA: Diagnosis present

## 2020-11-02 DIAGNOSIS — O9842 Viral hepatitis complicating childbirth: Secondary | ICD-10-CM | POA: Diagnosis present

## 2020-11-02 HISTORY — DX: Chronic kidney disease, unspecified: N18.9

## 2020-11-02 HISTORY — DX: Unspecified asthma, uncomplicated: J45.909

## 2020-11-02 LAB — COMPREHENSIVE METABOLIC PANEL
ALT: 16 U/L (ref 0–44)
AST: 24 U/L (ref 15–41)
Albumin: 2.6 g/dL — ABNORMAL LOW (ref 3.5–5.0)
Alkaline Phosphatase: 63 U/L (ref 38–126)
Anion gap: 8 (ref 5–15)
BUN: 10 mg/dL (ref 6–20)
CO2: 21 mmol/L — ABNORMAL LOW (ref 22–32)
Calcium: 8.7 mg/dL — ABNORMAL LOW (ref 8.9–10.3)
Chloride: 105 mmol/L (ref 98–111)
Creatinine, Ser: 0.75 mg/dL (ref 0.44–1.00)
GFR, Estimated: >60 mL/min (ref >60)
Glucose, Bld: 94 mg/dL (ref 70–99)
Potassium: 4 mmol/L (ref 3.5–5.1)
Sodium: 134 mmol/L — ABNORMAL LOW (ref 135–145)
Total Bilirubin: 0.4 mg/dL (ref 0.3–1.2)
Total Protein: 6.1 g/dL — ABNORMAL LOW (ref 6.5–8.1)

## 2020-11-02 LAB — URINALYSIS, MICROSCOPIC (REFLEX)

## 2020-11-02 LAB — CBC WITH DIFFERENTIAL/PLATELET
Abs Immature Granulocytes: 0.06 10*3/uL (ref 0.00–0.07)
Basophils Absolute: 0 10*3/uL (ref 0.0–0.1)
Basophils Relative: 0 %
Eosinophils Absolute: 0.2 10*3/uL (ref 0.0–0.5)
Eosinophils Relative: 2 %
HCT: 33.8 % — ABNORMAL LOW (ref 36.0–46.0)
Hemoglobin: 11.4 g/dL — ABNORMAL LOW (ref 12.0–15.0)
Immature Granulocytes: 1 %
Lymphocytes Relative: 28 %
Lymphs Abs: 2.9 10*3/uL (ref 0.7–4.0)
MCH: 30.4 pg (ref 26.0–34.0)
MCHC: 33.7 g/dL (ref 30.0–36.0)
MCV: 90.1 fL (ref 80.0–100.0)
Monocytes Absolute: 0.9 10*3/uL (ref 0.1–1.0)
Monocytes Relative: 8 %
Neutro Abs: 6.5 10*3/uL (ref 1.7–7.7)
Neutrophils Relative %: 61 %
Platelets: 213 10*3/uL (ref 150–400)
RBC: 3.75 MIL/uL — ABNORMAL LOW (ref 3.87–5.11)
RDW: 14.7 % (ref 11.5–15.5)
WBC: 10.6 10*3/uL — ABNORMAL HIGH (ref 4.0–10.5)
nRBC: 0 % (ref 0.0–0.2)

## 2020-11-02 LAB — URINALYSIS, ROUTINE W REFLEX MICROSCOPIC
Bilirubin Urine: NEGATIVE
Glucose, UA: NEGATIVE mg/dL
Hgb urine dipstick: NEGATIVE
Ketones, ur: NEGATIVE mg/dL
Nitrite: NEGATIVE
Protein, ur: NEGATIVE mg/dL
Specific Gravity, Urine: 1.015 (ref 1.005–1.030)
pH: 6.5 (ref 5.0–8.0)

## 2020-11-02 LAB — RAPID URINE DRUG SCREEN, HOSP PERFORMED
Amphetamines: NOT DETECTED
Barbiturates: NOT DETECTED
Benzodiazepines: NOT DETECTED
Cocaine: NOT DETECTED
Opiates: NOT DETECTED
Tetrahydrocannabinol: NOT DETECTED

## 2020-11-02 LAB — PROTEIN / CREATININE RATIO, URINE
Creatinine, Urine: 99.14 mg/dL
Protein Creatinine Ratio: 0.14 mg/mg{Cre} (ref 0.00–0.15)
Total Protein, Urine: 14 mg/dL

## 2020-11-02 LAB — TYPE AND SCREEN
ABO/RH(D): O POS
Antibody Screen: NEGATIVE

## 2020-11-02 LAB — RESP PANEL BY RT-PCR (FLU A&B, COVID) ARPGX2
Influenza A by PCR: NEGATIVE
Influenza B by PCR: NEGATIVE
SARS Coronavirus 2 by RT PCR: NEGATIVE

## 2020-11-02 MED ORDER — LABETALOL HCL 5 MG/ML IV SOLN: 20.0000 mg | INTRAVENOUS | Status: DC | PRN

## 2020-11-02 MED ORDER — LACTATED RINGERS IV SOLN: 500.0000 mL | Freq: Once | INTRAVENOUS | Status: DC

## 2020-11-02 MED ORDER — PHENYLEPHRINE 40 MCG/ML (10ML) SYRINGE FOR IV PUSH (FOR BLOOD PRESSURE SUPPORT)
80.0000 ug | PREFILLED_SYRINGE | INTRAVENOUS | Status: DC | PRN
  Filled 2020-11-02: qty 10

## 2020-11-02 MED ORDER — PHENYLEPHRINE 40 MCG/ML (10ML) SYRINGE FOR IV PUSH (FOR BLOOD PRESSURE SUPPORT): 80.0000 ug | PREFILLED_SYRINGE | INTRAVENOUS | Status: DC | PRN

## 2020-11-02 MED ORDER — OXYTOCIN BOLUS FROM INFUSION: 333.0000 mL | Freq: Once | INTRAVENOUS | Status: DC

## 2020-11-02 MED ORDER — ACETAMINOPHEN 325 MG PO TABS
650.0000 mg | ORAL_TABLET | ORAL | Status: DC | PRN
Start: 2020-11-02 — End: 2020-11-02

## 2020-11-02 MED ORDER — METOCLOPRAMIDE HCL 10 MG PO TABS: 10.0000 mg | ORAL_TABLET | Freq: Three times a day (TID) | ORAL | 0 refills | Status: DC

## 2020-11-02 MED ORDER — MISOPROSTOL 25 MCG QUARTER TABLET
25.0000 ug | ORAL_TABLET | ORAL | Status: DC
  Administered 2020-11-02 – 2020-11-03 (×2): 25 ug via ORAL
  Filled 2020-11-02 (×2): qty 1

## 2020-11-02 MED ORDER — MAGNESIUM SULFATE BOLUS VIA INFUSION
4.0000 g | Freq: Once | INTRAVENOUS | Status: DC
  Filled 2020-11-02: qty 1000

## 2020-11-02 MED ORDER — NICOTINE 7 MG/24HR TD PT24
7.0000 mg | MEDICATED_PATCH | Freq: Every day | TRANSDERMAL | Status: DC | PRN
  Filled 2020-11-02: qty 1

## 2020-11-02 MED ORDER — LIDOCAINE HCL (PF) 1 % IJ SOLN: 30.0000 mL | INTRAMUSCULAR | Status: DC | PRN

## 2020-11-02 MED ORDER — ONDANSETRON HCL 4 MG/2ML IJ SOLN: 4.0000 mg | Freq: Four times a day (QID) | INTRAMUSCULAR | Status: DC | PRN

## 2020-11-02 MED ORDER — FENTANYL-BUPIVACAINE-NACL 0.5-0.125-0.9 MG/250ML-% EP SOLN
12.0000 mL/h | EPIDURAL | Status: DC | PRN
Start: 2020-11-02 — End: 2020-11-03
  Administered 2020-11-03: 12 mL/h via EPIDURAL
  Filled 2020-11-02: qty 250

## 2020-11-02 MED ORDER — EPHEDRINE 5 MG/ML INJ: 10.0000 mg | INTRAVENOUS | Status: DC | PRN

## 2020-11-02 MED ORDER — OXYTOCIN-SODIUM CHLORIDE 30-0.9 UT/500ML-% IV SOLN: 2.5000 [IU]/h | INTRAVENOUS | Status: DC

## 2020-11-02 MED ORDER — LABETALOL HCL 5 MG/ML IV SOLN
80.0000 mg | INTRAVENOUS | Status: DC | PRN
Start: 2020-11-02 — End: 2020-11-06

## 2020-11-02 MED ORDER — BUTALBITAL-APAP-CAFFEINE 50-325-40 MG PO TABS
1.0000 | ORAL_TABLET | Freq: Every day | ORAL | Status: DC | PRN
  Administered 2020-11-03: 1 via ORAL
  Filled 2020-11-02: qty 1

## 2020-11-02 MED ORDER — MAGNESIUM SULFATE 40 GM/1000ML IV SOLN: 2.0000 g/h | INTRAVENOUS | Status: DC

## 2020-11-02 MED ORDER — ACETAMINOPHEN 325 MG PO TABS: 650.0000 mg | ORAL_TABLET | ORAL | Status: DC | PRN

## 2020-11-02 MED ORDER — METHADONE HCL 10 MG/ML PO CONC: 160.0000 mg | Freq: Three times a day (TID) | ORAL | Status: DC

## 2020-11-02 MED ORDER — LACTATED RINGERS IV SOLN: INTRAVENOUS | Status: DC

## 2020-11-02 MED ORDER — LEVONORGESTREL 19.5 MCG/DAY IU IUD
INTRAUTERINE_SYSTEM | Freq: Once | INTRAUTERINE | Status: AC
  Administered 2020-11-03: 1 via INTRAUTERINE
  Filled 2020-11-02: qty 1

## 2020-11-02 MED ORDER — FENTANYL CITRATE (PF) 100 MCG/2ML IJ SOLN
50.0000 ug | INTRAMUSCULAR | Status: DC | PRN
  Administered 2020-11-03 (×4): 100 ug via INTRAVENOUS
  Filled 2020-11-02 (×4): qty 2

## 2020-11-02 MED ORDER — MAGNESIUM SULFATE BOLUS VIA INFUSION
4.0000 g | Freq: Once | INTRAVENOUS | Status: AC
  Administered 2020-11-02: 4 g via INTRAVENOUS
  Filled 2020-11-02: qty 1000

## 2020-11-02 MED ORDER — HYDRALAZINE HCL 20 MG/ML IJ SOLN: 10.0000 mg | INTRAMUSCULAR | Status: DC | PRN

## 2020-11-02 MED ORDER — BUTALBITAL-APAP-CAFFEINE 50-325-40 MG PO TABS: 2.0000 | ORAL_TABLET | Freq: Once | ORAL | Status: DC

## 2020-11-02 MED ORDER — DIPHENHYDRAMINE HCL 50 MG/ML IJ SOLN: 12.5000 mg | INTRAMUSCULAR | Status: DC | PRN

## 2020-11-02 MED ORDER — LACTATED RINGERS IV SOLN: 500.0000 mL | INTRAVENOUS | Status: DC | PRN

## 2020-11-02 MED ORDER — SOD CITRATE-CITRIC ACID 500-334 MG/5ML PO SOLN: 30.0000 mL | ORAL | Status: DC | PRN

## 2020-11-02 MED ORDER — MAGNESIUM SULFATE 40 GM/1000ML IV SOLN
2.0000 g/h | INTRAVENOUS | Status: DC
  Administered 2020-11-03: 2 g/h via INTRAVENOUS
  Filled 2020-11-02 (×2): qty 1000

## 2020-11-02 MED ORDER — NICOTINE POLACRILEX 2 MG MT GUM
2.0000 mg | CHEWING_GUM | OROMUCOSAL | Status: DC | PRN
  Filled 2020-11-02: qty 1

## 2020-11-02 MED ORDER — OXYTOCIN BOLUS FROM INFUSION
333.0000 mL | Freq: Once | INTRAVENOUS | Status: AC
  Administered 2020-11-03: 333 mL via INTRAVENOUS

## 2020-11-02 MED ORDER — NICOTINE POLACRILEX 2 MG MT GUM: 2.0000 mg | CHEWING_GUM | OROMUCOSAL | Status: DC | PRN

## 2020-11-02 MED ORDER — LABETALOL HCL 5 MG/ML IV SOLN: 40.0000 mg | INTRAVENOUS | Status: DC | PRN

## 2020-11-02 MED ORDER — METHADONE HCL 10 MG PO TABS
165.0000 mg | ORAL_TABLET | Freq: Every day | ORAL | Status: DC
  Administered 2020-11-03: 165 mg via ORAL
  Filled 2020-11-02 (×2): qty 17

## 2020-11-02 NOTE — H&P (Signed)
OBSTETRIC ADMISSION HISTORY AND PHYSICAL  Molly Thornton is a 35 y.o. female [phone] with IUP at 79w4dby LMP presenting for IOL for PEC w SF. Seen in MAU with reports of headache, worsening edema. Also recently had new onset proteinuria. Endorses scotoma on arrival. She reports +FMs, No LOF, no VB. She plans on bottle feeding. She request pp IUD for birth control. [facility] --->  Estimated Date of Delivery: 11/19/20  Sono:    '@[redacted]w[redacted]d' , CWD, normal anatomy, cephalic presentation, 20354S 12% EFW   Prenatal History/Complications:   -substance use disorder -  Receives 1639mmethadone at methadone clinic daily. Denies other substance use. UDS negative on admission.  -tobacco use disorder - 1/2 ppd  -PTSD - sees counselor  -hx preeclampsia in prior pregnancies  -history of hepatitis c, treated in 2019, neg HCV RNA on prenatal testing    Past Medical History: Past Medical History:  Diagnosis Date  . Anxiety   . Asthma   . Bipolar disorder ([place]  . Chronic kidney disease   . Depression   . Hepatitis C    "never tx'd" (05/02/2018)  . Migraine    "daily" (05/02/2018)  . PTSD (post-traumatic stress disorder)   . [facility]    Past Surgical History: Past Surgical History:  Procedure Laterality Date  . WISDOM TOOTH EXTRACTION      Obstetrical History: OB History    Gravida  5   Para  2   Term  2   Preterm      AB  2   Living  2     SAB  1   IAB  1   Ectopic      Multiple      Live Births  2           Social History Social History   Socioeconomic History  . Marital status: Single    Spouse name: Not on file  . Number of children: Not on file  . Years of education: Not on file  . Highest education level: Not on file  Occupational History  . Not on file  Tobacco Use  . Smoking status: Current Every Day Smoker    Packs/day: 0.25    Years: 17.00    Pack years: 4.25    Types: Cigarettes   . Smokeless tobacco: Never Used  Vaping Use  . Vaping Use: Never used  Substance and Sexual Activity  . Alcohol use: Not Currently    Comment: 05/02/2018 "nothing since 2013"  . Drug use: Yes    Frequency: 1.0 times per week    Types: Marijuana, Oxycodone    Comment: "not currently" for heroin/oxy per pt- marijuana last use 04/25/20  . Sexual activity: Yes    Partners: Male    Birth control/protection: None  Other Topics Concern  . Not on file  Social History Narrative  . Not on file   Social Determinants of Health   Financial Resource Strain: Not on file  Food Insecurity: Not on file  Transportation Needs: Not on file  Physical Activity: Not on file  Stress: Not on file  Social Connections: Not on file    Family History: Family History  Problem Relation Age of Onset  . Heart disease Father   . Hypertension Father   . Asthma Brother   . Diabetes Mother   . Pancreatitis Mother   . Coronary artery disease Mother   .  Neuropathy Mother     Allergies: Allergies  Allergen Reactions  . Hydrocodone Hives  . Ivp Dye [Iodinated Diagnostic Agents] Rash    Medications Prior to Admission  Medication Sig Dispense Refill Last Dose  . cyclobenzaprine (FLEXERIL) 10 MG tablet Take 1 tablet (10 mg total) by mouth every 8 (eight) hours as needed for muscle spasms. 30 tablet 1 Past Week at Unknown time  . methadone (DOLOPHINE) 10 MG/ML solution Take 160 mg by mouth every 8 (eight) hours.   11/02/2020 at Unknown time  . Prenat-Fe Poly-Methfol-FA-DHA (VITAFOL ULTRA) 29-0.6-0.4-200 MG CAPS Take 1 capsule by mouth daily before breakfast. 90 capsule 4 11/02/2020 at Unknown time  . ALBUTEROL IN Inhale into the lungs.   More than a month at Unknown time  . Blood Pressure Monitoring (BLOOD PRESSURE KIT) DEVI 1 kit by Does not apply route once a week. 1 each 0   . Elastic Bandages & Supports (COMFORT FIT MATERNITY SUPP MED) MISC 1 Device by Does not apply route daily as needed. (Patient not taking:  Reported on 11/02/2020) 1 each 0   . metoCLOPramide (REGLAN) 10 MG tablet Take 1 tablet (10 mg total) by mouth 3 (three) times daily with meals. (Patient not taking: Reported on 11/02/2020) 10 tablet 0 Not Taking at Unknown time     Review of Systems   All systems reviewed and negative except as stated in HPI  Blood pressure 136/76, pulse (!) 105, temperature 98.7 F (37.1 C), temperature source Oral, resp. rate 17, weight 82.7 kg, last menstrual period 02/13/2020, SpO2 100 %. General appearance: alert, cooperative and appears stated age Lungs: clear to auscultation bilaterally Heart: regular rate and rhythm Abdomen: soft, non-tender; bowel sounds normal Pelvic: 1/50/-2 Extremities: Homans sign is negative, no sign of DVT DTR's: 2+ bilaterally, no clonus  Presentation: cephalic Fetal monitoring baseline 140, mod variability, pos accels, no decels  Uterine activity: irritability   Prenatal labs: ABO, Rh: O/Positive/-- (08/30 1455) Antibody: Negative (08/30 1455) Rubella: 5.79 (08/30 1455) RPR: Non Reactive (12/07 1000)  HBsAg: Negative (08/30 1455)  HIV: Non Reactive (12/07 1000)  GBS:    2 hr Glucola failed but ate prior to testing. Repeat testing appropriate  Genetic screening  Low risk NIPS Anatomy US normal   Prenatal Transfer Tool  Maternal Diabetes: No Genetic Screening: Normal Maternal Ultrasounds/Referrals: Normal Fetal Ultrasounds or other Referrals:  None Maternal Substance Abuse:  Yes:  Type: Smoker, Methadone Significant Maternal Medications:  Meds include: Other:  Methadone  Significant Maternal Lab Results: Other: HCV RNA qual neg. GBS unknown   Results for orders placed or performed during the hospital encounter of 11/02/20 (from the past 24 hour(s))  Urinalysis, Routine w reflex microscopic Urine, Clean Catch   Collection Time: 11/02/20  7:34 PM  Result Value Ref Range   Color, Urine YELLOW YELLOW   APPearance CLEAR CLEAR   Specific Gravity, Urine 1.015  1.005 - 1.030   pH 6.5 5.0 - 8.0   Glucose, UA NEGATIVE NEGATIVE mg/dL   Hgb urine dipstick NEGATIVE NEGATIVE   Bilirubin Urine NEGATIVE NEGATIVE   Ketones, ur NEGATIVE NEGATIVE mg/dL   Protein, ur NEGATIVE NEGATIVE mg/dL   Nitrite NEGATIVE NEGATIVE   Leukocytes,Ua SMALL (A) NEGATIVE  Urinalysis, Microscopic (reflex)   Collection Time: 11/02/20  7:34 PM  Result Value Ref Range   RBC / HPF 0-5 0 - 5 RBC/hpf   WBC, UA 0-5 0 - 5 WBC/hpf   Bacteria, UA RARE (A) NONE SEEN   Squamous  Epithelial / LPF 6-10 0 - 5   Mucus PRESENT     Patient Active Problem List   Diagnosis Date Noted  . Hx of preeclampsia, prior pregnancy, currently pregnant 10/05/2020  . Gestational diabetes 09/14/2020  . Substance abuse affecting pregnancy, antepartum 05/23/2020  . Supervision of high risk pregnancy, antepartum 05/10/2020  . Cocaine abuse ([place]) 06/19/2018  . Opiate abuse, continuous ([place]) 06/19/2018  . Cannabis abuse 06/19/2018  . Benzodiazepine abuse ([place]) 06/19/2018  . Substance induced mood disorder ([place]) 06/19/2018  . Chronic hepatitis C without hepatic coma ([place]) 05/14/2018  . Acute nonintractable headache   . History of pyelonephritis     Assessment/Plan:  Oliva Beman is a 35 y.o. [id number] at 69w4dhere for IOL for PEC w SF.  #Induction of Labor: FB placed, cytotec 57m buccal. Reevaluate in four hours.   #Atypical PEC w SF: Normotensive, however with new onset proteinuria, headache w scotoma, new edema. Hx of PreE. Will treat for PE with severe features. Start magnesium, monitor BP closely.   #Substance Use Disorder: stable, on methadone. UDS neg (will not show methadone, confirmatory test sent). 16519methadone daily ordered. Prescription monitoring database reviewed. SW consult pp.  #Tobacco Use Disorder: nicotine replacement therapy prn   #Pain: Epidural prn #FWB: Cat I  #ID:  gbs unknown, collected in MAU.  #MOF: bottle  #MOC:pp Liletta IUD. Consented and ordered.  #Circ:   Yes   JulJanet BerlinD  11/02/2020, 8:44 PM

## 2020-11-02 NOTE — Progress Notes (Addendum)
Per Donia Ast, NP, spoke with patient re: HA she presented with at [facility] visit this morning. Pt states she still has HA. 5/10 on pain scale. She took 1000mg  of tylenol shortly after her visit and has not had any relief at this point. Pt states she is staying hydrated and drinks water and eats ice throughout the day. Pt denies any significant swelling, visual changes, floaters, or nausea. After speaking with she advised patient to take reglan which she sent in to patient's pharmacy with 1000mg  of tylenol when she is due for another dosage. If HA does not improve or she begins to experience any other symptoms, she will need to be evaluated at MAU. Pt advised of this plan and verbalized understanding.

## 2020-11-02 NOTE — Progress Notes (Signed)
+   Fetal movement. Pt c/o increased HA and swelling. Pt states the HA have been worse over the last 3 day. Urine dip Neg gluc/Neg Prot.

## 2020-11-02 NOTE — Progress Notes (Signed)
Subjective:  Molly Thornton is a 35 y.o. [id number] at [redacted]w[redacted]d being seen today for ongoing prenatal care.  She is currently monitored for the following issues for this low-risk pregnancy and has Acute nonintractable headache; History of pyelonephritis; Chronic hepatitis C without hepatic coma (HCC); Cocaine abuse (HCC); Opiate abuse, continuous (HCC); Cannabis abuse; Benzodiazepine abuse (HCC); Substance induced mood disorder (HCC); Supervision of high risk pregnancy, antepartum; Substance abuse affecting pregnancy, antepartum; Gestational diabetes; and Hx of preeclampsia, prior pregnancy, currently pregnant on their problem list.  Patient reports headache.  Contractions: Irregular. Vag. Bleeding: None.  Movement: Present. Denies leaking of fluid.   The following portions of the patient's history were reviewed and updated as appropriate: allergies, current medications, past family history, past medical history, past social history, past surgical history and problem list. Problem list updated.  Objective:   Vitals:   11/02/20 0911  BP: 127/79  Pulse: 96    Fetal Status: Fetal Heart Rate (bpm): 158   Movement: Present     General:  Alert, oriented and cooperative. Patient is in no acute distress.  Skin: Skin is warm and dry. No rash noted.   Cardiovascular: Normal heart rate noted  Respiratory: Normal respiratory effort, no problems with respiration noted  Abdomen: Soft, gravid, appropriate for gestational age. Pain/Pressure: Present     Pelvic: Vag. Bleeding: None     Cervical exam deferred        Extremities: Normal range of motion.  Edema: Mild pitting, slight indentation  Mental Status: Normal mood and affect. Normal behavior. Normal judgment and thought content.   Urinalysis:      Assessment and Plan:  Pregnancy: [id number] at [redacted]w[redacted]d  1. Supervision of high risk pregnancy, antepartum -GBS/cultures today -[place] scheduled 11/09/2020  2. Hx of preeclampsia, prior pregnancy, currently  pregnant -BP today 127/79  3. Substance abuse affecting pregnancy, antepartum -daily methadone -seeing psychiatrist at methadone clinic  4. Chronic hepatitis C without hepatic coma (HCC) -treated, normal liver enzymes 10/24/2020  5. Gestational diabetes mellitus (GDM) in third trimester, gestational diabetes method of control unspecified -pt failed GTT but this was because she ate after midnight, will repeat GTT ASAP, no GDM diagnosis at this time -1-hr glucose today   6. Pregnancy headache in third trimester -pt reports HA for several days that she has not yet taken anything for; patient rates HA as 6/10 -pt reports hx of migraines and states that this feels like a migraine, but she has not had migraines in pregnancy until now, patient also fasting since midnight -pt has hx of pre-eclampsia, BP today 127/79 -pt advised to go home after visit, take 1000mg  Tylenol, eat, drink water, rest; if no relief, call office to discuss and we can possibly send her additional medication to try at home; if no relief after 2nd medication, pt should report to MAU for visit -STAT pre-eclampsia labs drawn in office today  Term labor symptoms and general obstetric precautions including but not limited to vaginal bleeding, contractions, leaking of fluid and fetal movement were reviewed in detail with the patient. I discussed the assessment and treatment plan with the patient. The patient was provided an opportunity to ask questions and all were answered. The patient agreed with the plan and demonstrated an understanding of the instructions. The patient was advised to call back or seek an in-person office evaluation/go to MAU at [facility] for any urgent or concerning symptoms. Please refer to After Visit Summary for other counseling recommendations.   Return in  about 1 week (around 11/09/2020) for in-person HOB/MD only.   Sulay Brymer, Odie Sera, NP

## 2020-11-02 NOTE — Patient Instructions (Signed)
Maternity Assessment Unit (MAU)  The Maternity Assessment Unit (MAU) is located at the [facility] and [place] at [facility]. The address is: [address], [place], [place], [place] [address]. Please see map below for additional directions.    The Maternity Assessment Unit is designed to help you during your pregnancy, and for up to 6 weeks after delivery, with any pregnancy- or postpartum-related emergencies, if you think you are in labor, or if your water has broken. For example, if you experience nausea and vomiting, vaginal bleeding, severe abdominal or pelvic pain, elevated blood pressure or other problems related to your pregnancy or postpartum time, please come to the Maternity Assessment Unit for assistance.        Signs and Symptoms of Labor Labor is the body's natural process of moving the baby and the placenta out of the uterus. The process of labor usually starts when the baby is full-term, between 23 and 40 weeks of pregnancy. Signs and symptoms that you are close to going into labor As your body prepares for labor and the birth of your baby, you may notice the following symptoms in the weeks and days before true labor starts:  Passing a small amount of thick, bloody mucus from your vagina. This is called normal bloody show or losing your mucus plug. This may happen more than a week before labor begins, or right before labor begins, as the opening of the cervix starts to widen (dilate). For some women, the entire mucus plug passes at once. For others, pieces of the mucus plug may gradually pass over several days.  Your baby moving (dropping) lower in your pelvis to get into position for birth (lightening). When this happens, you may feel more pressure on your bladder and pelvic bone and less pressure on your ribs. This may make it easier to breathe. It may also cause you to need to urinate more often and have problems with bowel movements.  Having "practice  contractions," also called Braxton Hicks contractions or false labor. These occur at irregular (unevenly spaced) intervals that are more than 10 minutes apart. False labor contractions are common after exercise or sexual activity. They will stop if you change position, rest, or drink fluids. These contractions are usually mild and do not get stronger over time. They may feel like: ? A backache or back pain. ? Mild cramps, similar to menstrual cramps. ? Tightening or pressure in your abdomen. Other early symptoms include:  Nausea or loss of appetite.  Diarrhea.  Having a sudden burst of energy, or feeling very tired.  Mood changes.  Having trouble sleeping.   Signs and symptoms that labor has begun Signs that you are in labor may include:  Having contractions that come at regular (evenly spaced) intervals and increase in intensity. This may feel like more intense tightening or pressure in your abdomen that moves to your back. ? Contractions may also feel like rhythmic pain in your upper thighs or back that comes and goes at regular intervals. ? For first-time mothers, this change in intensity of contractions often occurs at a more gradual pace. ? Women who have given birth before may notice a more rapid progression of contraction changes.  Feeling pressure in the vaginal area.  Your water breaking (rupture of membranes). This is when the sac of fluid that surrounds your baby breaks. Fluid leaking from your vagina may be clear or blood-tinged. Labor usually starts within 24 hours of your water breaking, but it  may take longer to begin. ? Some women may feel a sudden gush of fluid. ? Others notice that their underwear repeatedly becomes damp. Follow these instructions at home:  When labor starts, or if your water breaks, call your health care provider or nurse care line. Based on your situation, they will determine when you should go in for an exam.  During early labor, you may be able  to rest and manage symptoms at home. Some strategies to try at home include: ? Breathing and relaxation techniques. ? Taking a warm bath or shower. ? Listening to music. ? Using a heating pad on the lower back for pain. If you are directed to use heat:  Place a towel between your skin and the heat source.  Leave the heat on for 20-30 minutes.  Remove the heat if your skin turns bright red. This is especially important if you are unable to feel pain, heat, or cold. You may have a greater risk of getting burned.   Contact a health care provider if:  Your labor has started.  Your water breaks. Get help right away if:  You have painful, regular contractions that are 5 minutes apart or less.  Labor starts before you are [redacted] weeks along in your pregnancy.  You have a fever.  You have bright red blood coming from your vagina.  You do not feel your baby moving.  You have a severe headache with or without vision problems.  You have severe nausea, vomiting, or diarrhea.  You have chest pain or shortness of breath. These symptoms may represent a serious problem that is an emergency. Do not wait to see if the symptoms will go away. Get medical help right away. Call your local emergency services (911 in the [place] Do not drive yourself to the hospital. Summary  Labor is your body's natural process of moving your baby and the placenta out of your uterus.  The process of labor usually starts when your baby is full-term, between 82 and 40 weeks of pregnancy.  When labor starts, or if your water breaks, call your health care provider or nurse care line. Based on your situation, they will determine when you should go in for an exam. This information is not intended to replace advice given to you by your health care provider. Make sure you discuss any questions you have with your health care provider. Document Revised: 07/02/2020 Document Reviewed: 07/02/2020 Elsevier Patient Education  2021  Elsevier Inc.        Rosen's Emergency Medicine: Concepts and Clinical Practice (9th ed., pp. 2296- 2312). Elsevier.">  Braxton Hicks Contractions Contractions of the uterus can occur throughout pregnancy, but they are not always a sign that you are in labor. You may have practice contractions called Braxton Hicks contractions. These false labor contractions are sometimes confused with true labor. What are Deberah Pelton contractions? Braxton Hicks contractions are tightening movements that occur in the muscles of the uterus before labor. Unlike true labor contractions, these contractions do not result in opening (dilation) and thinning of the cervix. Toward the end of pregnancy (32-34 weeks), Braxton Hicks contractions can happen more often and may become stronger. These contractions are sometimes difficult to tell apart from true labor because they can be very uncomfortable. You should not feel embarrassed if you go to the hospital with false labor. Sometimes, the only way to tell if you are in true labor is for your health care provider to look for changes in the  cervix. The health care provider will do a physical exam and may monitor your contractions. If you are not in true labor, the exam should show that your cervix is not dilating and your water has not broken. If there are no other health problems associated with your pregnancy, it is completely safe for you to be sent home with false labor. You may continue to have Braxton Hicks contractions until you go into true labor. How to tell the difference between true labor and false labor True labor  Contractions last 30-70 seconds.  Contractions become very regular.  Discomfort is usually felt in the top of the uterus, and it spreads to the lower abdomen and low back.  Contractions do not go away with walking.  Contractions usually become more intense and increase in frequency.  The cervix dilates and gets thinner. False  labor  Contractions are usually shorter and not as strong as true labor contractions.  Contractions are usually irregular.  Contractions are often felt in the front of the lower abdomen and in the groin.  Contractions may go away when you walk around or change positions while lying down.  Contractions get weaker and are shorter-lasting as time goes on.  The cervix usually does not dilate or become thin. Follow these instructions at home:  Take over-the-counter and prescription medicines only as told by your health care provider.  Keep up with your usual exercises and follow other instructions from your health care provider.  Eat and drink lightly if you think you are going into labor.  If Braxton Hicks contractions are making you uncomfortable: ? Change your position from lying down or resting to walking, or change from walking to resting. ? Sit and rest in a tub of warm water. ? Drink enough fluid to keep your urine pale yellow. Dehydration may cause these contractions. ? Do slow and deep breathing several times an hour.  Keep all follow-up prenatal visits as told by your health care provider. This is important.   Contact a health care provider if:  You have a fever.  You have continuous pain in your abdomen. Get help right away if:  Your contractions become stronger, more regular, and closer together.  You have fluid leaking or gushing from your vagina.  You pass blood-tinged mucus (bloody show).  You have bleeding from your vagina.  You have low back pain that you never had before.  You feel your baby's head pushing down and causing pelvic pressure.  Your baby is not moving inside you as much as it used to. Summary  Contractions that occur before labor are called Braxton Hicks contractions, false labor, or practice contractions.  Braxton Hicks contractions are usually shorter, weaker, farther apart, and less regular than true labor contractions. True labor  contractions usually become progressively stronger and regular, and they become more frequent.  Manage discomfort from [facility] contractions by changing position, resting in a warm bath, drinking plenty of water, or practicing deep breathing. This information is not intended to replace advice given to you by your health care provider. Make sure you discuss any questions you have with your health care provider. Document Revised: 08/23/2017 Document Reviewed: 01/24/2017 Elsevier Patient Education  2021 [employer].

## 2020-11-02 NOTE — MAU Provider Note (Signed)
History     CSN: [id number]  Arrival date and time: 11/02/20 1836   Event Date/Time   First Provider Initiated Contact with Patient 11/02/20 2007      Chief Complaint  Patient presents with  . Headache   HPI  Molly Thornton is a 35 y.o. female [id number] @ 13w4don methadone,  with a history of preeclampsia, here with HA. She reports the HA has been present for a few days. She has tried tylenol with no relief. She also reports right upper quadrant pain x 2 weeks. She reports the pain is getting worse. The pain is constant, and at times she gets a burning sensation.  She was induced with her previous pregnancy d/t Pre E. The HA is in the front of her HA. At times the HA is in her temples. With the HA she has noticed blacks spots in her vision.  Had some missed appts this pregnancy.   OB History    Gravida  5   Para  2   Term  2   Preterm      AB  2   Living  2     SAB  1   IAB  1   Ectopic      Multiple      Live Births  2           Past Medical History:  Diagnosis Date  . Anxiety   . Asthma   . Bipolar disorder ([facility]   . Chronic kidney disease   . Depression   . Hepatitis C    "never tx'd" (05/02/2018)  . Migraine    "daily" (05/02/2018)  . PTSD (post-traumatic stress disorder)   . Sickle cell trait ([facility]     Past Surgical History:  Procedure Laterality Date  . WISDOM TOOTH EXTRACTION      Family History  Problem Relation Age of Onset  . Heart disease Father   . Hypertension Father   . Asthma Brother   . Diabetes Mother   . Pancreatitis Mother   . Coronary artery disease Mother   . Neuropathy Mother     Social History   Tobacco Use  . Smoking status: Current Every Day Smoker    Packs/day: 0.25    Years: 17.00    Pack years: 4.25    Types: Cigarettes  . Smokeless tobacco: Never Used  Vaping Use  . Vaping Use: Never used  Substance Use Topics  . Alcohol use: Not Currently    Comment: 05/02/2018 "nothing since 2013"  . Drug use: Yes     Frequency: 1.0 times per week    Types: Marijuana, Oxycodone    Comment: "not currently" for heroin/oxy per pt- marijuana last use 04/25/20    Allergies:  Allergies  Allergen Reactions  . Hydrocodone Hives  . Ivp Dye [Iodinated Diagnostic Agents] Rash    Medications Prior to Admission  Medication Sig Dispense Refill Last Dose  . cyclobenzaprine (FLEXERIL) 10 MG tablet Take 1 tablet (10 mg total) by mouth every 8 (eight) hours as needed for muscle spasms. 30 tablet 1 Past Week at Unknown time  . methadone (DOLOPHINE) 10 MG/ML solution Take 160 mg by mouth every 8 (eight) hours.   11/02/2020 at Unknown time  . Prenat-Fe Poly-Methfol-FA-DHA (VITAFOL ULTRA) 29-0.6-0.4-200 MG CAPS Take 1 capsule by mouth daily before breakfast. 90 capsule 4 11/02/2020 at Unknown time  . ALBUTEROL IN Inhale into the lungs.   More than a month at Unknown time  .  Blood Pressure Monitoring (BLOOD PRESSURE KIT) DEVI 1 kit by Does not apply route once a week. 1 each 0   . Elastic Bandages & Supports (COMFORT FIT MATERNITY SUPP MED) MISC 1 Device by Does not apply route daily as needed. (Patient not taking: Reported on 11/02/2020) 1 each 0   . metoCLOPramide (REGLAN) 10 MG tablet Take 1 tablet (10 mg total) by mouth 3 (three) times daily with meals. (Patient not taking: Reported on 11/02/2020) 10 tablet 0 Not Taking at Unknown time   Results for orders placed or performed during the hospital encounter of 11/02/20 (from the past 48 hour(s))  Urinalysis, Routine w reflex microscopic Urine, Clean Catch     Status: Abnormal   Collection Time: 11/02/20  7:34 PM  Result Value Ref Range   Color, Urine YELLOW YELLOW   APPearance CLEAR CLEAR   Specific Gravity, Urine 1.015 1.005 - 1.030   pH 6.5 5.0 - 8.0   Glucose, UA NEGATIVE NEGATIVE mg/dL   Hgb urine dipstick NEGATIVE NEGATIVE   Bilirubin Urine NEGATIVE NEGATIVE   Ketones, ur NEGATIVE NEGATIVE mg/dL   Protein, ur NEGATIVE NEGATIVE mg/dL   Nitrite NEGATIVE NEGATIVE    Leukocytes,Ua SMALL (A) NEGATIVE    Comment: Performed at [facility] [address] [place], [place] [address]  Urinalysis, Microscopic (reflex)     Status: Abnormal   Collection Time: 11/02/20  7:34 PM  Result Value Ref Range   RBC / HPF 0-5 0 - 5 RBC/hpf   WBC, UA 0-5 0 - 5 WBC/hpf   Bacteria, UA RARE (A) NONE SEEN   Squamous Epithelial / LPF 6-10 0 - 5   Mucus PRESENT     Comment: Performed at [facility], [place] [address] [place], [place] [address]   Review of Systems  Eyes: Positive for visual disturbance. Negative for photophobia.  Gastrointestinal: Positive for abdominal pain (Right upper abdominal pain ).  Musculoskeletal: Positive for joint swelling.  Neurological: Positive for headaches.   Physical Exam   Blood pressure 129/80, pulse (!) 102, temperature 98.7 F (37.1 C), temperature source Oral, resp. rate 17, weight 82.7 kg, last menstrual period 02/13/2020, SpO2 99 %.  Physical Exam Vitals and nursing note reviewed.  Constitutional:      General: She is not in acute distress.    Appearance: She is well-developed. She is not ill-appearing or toxic-appearing.  HENT:     Head: Normocephalic.  Abdominal:     Tenderness: There is abdominal tenderness in the right upper quadrant. There is no guarding or rebound. Negative signs include Murphy's sign.  Skin:    General: Skin is warm.  Neurological:     Mental Status: She is alert and oriented to person, place, and time.     Deep Tendon Reflexes: Reflexes normal (Negative clonus ).  Psychiatric:        Mood and Affect: Mood normal.        Behavior: Behavior normal.    Fetal Tracing: Baseline: 130 bpm Variability: Moderate  Accelerations: 15x15 Decelerations: None Toco: None  MAU Course  Procedures  None  MDM  Discussed patient with Dr. Harolyn Rutherford, discussed patient presentation, hx and risks of pre E. BP normal however + protein in urine, with symptoms of severe pre E.  Will admit for severe  preeclampsia and start induction. Patient is agreeable with plan of care.  Serum creatine on 10/24/20: 0.98 Fioricet ordered 2 tabs PIH labs collected   Assessment and Plan  A:  1. Severe preeclampsia, third trimester   2. Hx of preeclampsia, prior pregnancy, currently pregnant   3. Substance abuse affecting pregnancy, antepartum   4. [redacted] weeks gestation of pregnancy     P:  Admit to labor for induction of labor  GBS collected, however not resulted  PCR GBS collected.    Lezlie Lye, NP 11/02/2020 8:39 PM

## 2020-11-02 NOTE — Progress Notes (Signed)
RX Reglan for HA.  Riti Rollyson, Odie Sera, NP  3:20 PM 11/02/2020

## 2020-11-02 NOTE — MAU Note (Signed)
Patient states she has a headache and called the office who told her to come be evaluated for preeclampsia.  States her BP at home was normal but the HA won't go away with tylenol, she endorses seeing floaters yesterday and has noticed an increase in her swelling.  Also endorses some burning epigastric pain.  Denies LOF/VB.  Some occasional contractions.  + FM.

## 2020-11-03 ENCOUNTER — Other Ambulatory Visit: Payer: Medicaid Other

## 2020-11-03 ENCOUNTER — Encounter (HOSPITAL_COMMUNITY): Payer: Self-pay | Admitting: Obstetrics & Gynecology

## 2020-11-03 ENCOUNTER — Encounter: Payer: Medicaid Other | Admitting: Obstetrics and Gynecology

## 2020-11-03 ENCOUNTER — Inpatient Hospital Stay (HOSPITAL_COMMUNITY): Payer: Medicaid Other | Admitting: Anesthesiology

## 2020-11-03 DIAGNOSIS — Z30430 Encounter for insertion of intrauterine contraceptive device: Secondary | ICD-10-CM

## 2020-11-03 DIAGNOSIS — Z975 Presence of (intrauterine) contraceptive device: Secondary | ICD-10-CM

## 2020-11-03 DIAGNOSIS — O24439 Gestational diabetes mellitus in the puerperium, unspecified control: Secondary | ICD-10-CM

## 2020-11-03 DIAGNOSIS — Z3A37 37 weeks gestation of pregnancy: Secondary | ICD-10-CM

## 2020-11-03 DIAGNOSIS — Z8759 Personal history of other complications of pregnancy, childbirth and the puerperium: Secondary | ICD-10-CM

## 2020-11-03 DIAGNOSIS — O1415 Severe pre-eclampsia, complicating the puerperium: Secondary | ICD-10-CM

## 2020-11-03 DIAGNOSIS — O1414 Severe pre-eclampsia complicating childbirth: Secondary | ICD-10-CM

## 2020-11-03 LAB — CBC
HCT: 36.6 % (ref 36.0–46.0)
HCT: 37.5 % (ref 36.0–46.0)
Hematocrit: 34.6 % (ref 34.0–46.6)
Hemoglobin: 11.8 g/dL (ref 11.1–15.9)
Hemoglobin: 12.2 g/dL (ref 12.0–15.0)
Hemoglobin: 13.2 g/dL (ref 12.0–15.0)
MCH: 30.6 pg (ref 26.6–33.0)
MCH: 30.1 pg (ref 26.0–34.0)
MCH: 31.1 pg (ref 26.0–34.0)
MCHC: 34.1 g/dL (ref 31.5–35.7)
MCHC: 33.3 g/dL (ref 30.0–36.0)
MCHC: 35.2 g/dL (ref 30.0–36.0)
MCV: 90 fL (ref 79–97)
MCV: 90.4 fL (ref 80.0–100.0)
MCV: 88.4 fL (ref 80.0–100.0)
Platelets: 215 10*3/uL (ref 150–450)
Platelets: 199 10*3/uL (ref 150–400)
Platelets: 126 10*3/uL — ABNORMAL LOW (ref 150–400)
RBC: 3.86 x10E6/uL (ref 3.77–5.28)
RBC: 4.05 MIL/uL (ref 3.87–5.11)
RBC: 4.24 MIL/uL (ref 3.87–5.11)
RDW: 14 % (ref 11.7–15.4)
RDW: 14.9 % (ref 11.5–15.5)
RDW: 14.6 % (ref 11.5–15.5)
WBC: 7.4 10*3/uL (ref 3.4–10.8)
WBC: 10.8 10*3/uL — ABNORMAL HIGH (ref 4.0–10.5)
WBC: 14.2 10*3/uL — ABNORMAL HIGH (ref 4.0–10.5)
nRBC: 0 % (ref 0.0–0.2)
nRBC: 0 % (ref 0.0–0.2)

## 2020-11-03 LAB — COMPREHENSIVE METABOLIC PANEL
ALT: 13 IU/L (ref 0–32)
ALT: 15 U/L (ref 0–44)
AST: 19 IU/L (ref 0–40)
AST: 26 U/L (ref 15–41)
Albumin/Globulin Ratio: 1.2 (ref 1.2–2.2)
Albumin: 3.3 g/dL — ABNORMAL LOW (ref 3.8–4.8)
Albumin: 2.1 g/dL — ABNORMAL LOW (ref 3.5–5.0)
Alkaline Phosphatase: 90 IU/L (ref 44–121)
Alkaline Phosphatase: 67 U/L (ref 38–126)
Anion gap: 10 (ref 5–15)
BUN/Creatinine Ratio: 16 (ref 9–23)
BUN: 12 mg/dL (ref 6–20)
BUN: 6 mg/dL (ref 6–20)
Bilirubin Total: 0.2 mg/dL (ref 0.0–1.2)
CO2: 20 mmol/L (ref 20–29)
CO2: 17 mmol/L — ABNORMAL LOW (ref 22–32)
Calcium: 9 mg/dL (ref 8.7–10.2)
Calcium: 7.4 mg/dL — ABNORMAL LOW (ref 8.9–10.3)
Chloride: 106 mmol/L (ref 96–106)
Chloride: 104 mmol/L (ref 98–111)
Creatinine, Ser: 0.74 mg/dL (ref 0.57–1.00)
Creatinine, Ser: 0.91 mg/dL (ref 0.44–1.00)
GFR calc Af Amer: 121 mL/min/{1.73_m2} (ref >59)
GFR calc non Af Amer: 105 mL/min/{1.73_m2} (ref >59)
GFR, Estimated: >60 mL/min (ref >60)
Globulin, Total: 2.7 g/dL (ref 1.5–4.5)
Glucose, Bld: 159 mg/dL — ABNORMAL HIGH (ref 70–99)
Glucose: 177 mg/dL — ABNORMAL HIGH (ref 65–99)
Potassium: 4.3 mmol/L (ref 3.5–5.2)
Potassium: 3.7 mmol/L (ref 3.5–5.1)
Sodium: 135 mmol/L (ref 134–144)
Sodium: 131 mmol/L — ABNORMAL LOW (ref 135–145)
Total Bilirubin: 0.6 mg/dL (ref 0.3–1.2)
Total Protein: 6 g/dL (ref 6.0–8.5)
Total Protein: 5 g/dL — ABNORMAL LOW (ref 6.5–8.1)

## 2020-11-03 LAB — PROTEIN / CREATININE RATIO, URINE
Creatinine, Urine: 41.1 mg/dL
Creatinine, Urine: 15.37 mg/dL
Protein, Ur: 15.4 mg/dL
Protein/Creat Ratio: 375 mg/g creat — ABNORMAL HIGH (ref 0–200)
Total Protein, Urine: <6 mg/dL

## 2020-11-03 LAB — RPR: RPR Ser Ql: NONREACTIVE

## 2020-11-03 LAB — CERVICOVAGINAL ANCILLARY ONLY
Chlamydia: NEGATIVE
Comment: NEGATIVE
Comment: NEGATIVE
Comment: NORMAL
Neisseria Gonorrhea: NEGATIVE
Trichomonas: NEGATIVE

## 2020-11-03 LAB — GLUCOSE TOLERANCE, 1 HOUR: Glucose, 1Hr PP: 164 mg/dL (ref 65–199)

## 2020-11-03 LAB — GROUP B STREP BY PCR: Group B strep by PCR: NEGATIVE

## 2020-11-03 MED ORDER — FAMOTIDINE IN NACL 20-0.9 MG/50ML-% IV SOLN: 20.0000 mg | Freq: Once | INTRAVENOUS | Status: DC

## 2020-11-03 MED ORDER — LIDOCAINE HCL (PF) 1 % IJ SOLN
INTRAMUSCULAR | Status: DC | PRN
  Administered 2020-11-03 (×2): 4 mL via EPIDURAL

## 2020-11-03 MED ORDER — IBUPROFEN 600 MG PO TABS
600.0000 mg | ORAL_TABLET | Freq: Four times a day (QID) | ORAL | Status: DC
  Administered 2020-11-03 – 2020-11-06 (×9): 600 mg via ORAL
  Filled 2020-11-03 (×9): qty 1

## 2020-11-03 MED ORDER — BUTALBITAL-APAP-CAFFEINE 50-325-40 MG PO TABS
1.0000 | ORAL_TABLET | Freq: Once | ORAL | Status: AC
  Administered 2020-11-04: 1 via ORAL
  Filled 2020-11-03 (×2): qty 1

## 2020-11-03 MED ORDER — DIPHENHYDRAMINE HCL 50 MG/ML IJ SOLN
25.0000 mg | Freq: Once | INTRAMUSCULAR | Status: AC
  Administered 2020-11-03: 25 mg via INTRAVENOUS
  Filled 2020-11-03: qty 1

## 2020-11-03 MED ORDER — METHADONE HCL 10 MG PO TABS
165.0000 mg | ORAL_TABLET | Freq: Every day | ORAL | Status: DC
  Administered 2020-11-04 – 2020-11-06 (×3): 165 mg via ORAL
  Filled 2020-11-03 (×3): qty 1

## 2020-11-03 MED ORDER — COCONUT OIL OIL
1.0000 "application " | TOPICAL_OIL | Status: DC | PRN
  Administered 2020-11-05: 1 via TOPICAL

## 2020-11-03 MED ORDER — TETANUS-DIPHTH-ACELL PERTUSSIS 5-2.5-18.5 LF-MCG/0.5 IM SUSY: 0.5000 mL | PREFILLED_SYRINGE | Freq: Once | INTRAMUSCULAR | Status: DC

## 2020-11-03 MED ORDER — DIPHENHYDRAMINE HCL 25 MG PO CAPS: 25.0000 mg | ORAL_CAPSULE | Freq: Four times a day (QID) | ORAL | Status: DC | PRN

## 2020-11-03 MED ORDER — ONDANSETRON HCL 4 MG/2ML IJ SOLN: 4.0000 mg | INTRAMUSCULAR | Status: DC | PRN

## 2020-11-03 MED ORDER — PRENATAL MULTIVITAMIN CH
1.0000 | ORAL_TABLET | Freq: Every day | ORAL | Status: DC
  Administered 2020-11-04 – 2020-11-06 (×3): 1 via ORAL
  Filled 2020-11-03 (×3): qty 1

## 2020-11-03 MED ORDER — LACTATED RINGERS IV SOLN: 500.0000 mL | Freq: Once | INTRAVENOUS | Status: DC

## 2020-11-03 MED ORDER — SENNOSIDES-DOCUSATE SODIUM 8.6-50 MG PO TABS
2.0000 | ORAL_TABLET | Freq: Every day | ORAL | Status: DC
  Administered 2020-11-04 – 2020-11-06 (×2): 2 via ORAL
  Filled 2020-11-03 (×3): qty 2

## 2020-11-03 MED ORDER — SIMETHICONE 80 MG PO CHEW: 80.0000 mg | CHEWABLE_TABLET | ORAL | Status: DC | PRN

## 2020-11-03 MED ORDER — ONDANSETRON HCL 4 MG PO TABS: 4.0000 mg | ORAL_TABLET | ORAL | Status: DC | PRN

## 2020-11-03 MED ORDER — WITCH HAZEL-GLYCERIN EX PADS: 1.0000 "application " | MEDICATED_PAD | CUTANEOUS | Status: DC | PRN

## 2020-11-03 MED ORDER — DIBUCAINE (PERIANAL) 1 % EX OINT: 1.0000 "application " | TOPICAL_OINTMENT | CUTANEOUS | Status: DC | PRN

## 2020-11-03 MED ORDER — FAMOTIDINE IN NACL 20-0.9 MG/50ML-% IV SOLN
INTRAVENOUS | Status: AC
  Administered 2020-11-03: 20 mg
  Filled 2020-11-03: qty 50

## 2020-11-03 MED ORDER — METOCLOPRAMIDE HCL 5 MG/ML IJ SOLN
10.0000 mg | Freq: Once | INTRAMUSCULAR | Status: AC
  Administered 2020-11-03: 10 mg via INTRAVENOUS
  Filled 2020-11-03: qty 2

## 2020-11-03 MED ORDER — ACETAMINOPHEN 500 MG PO TABS
1000.0000 mg | ORAL_TABLET | Freq: Once | ORAL | Status: AC
  Administered 2020-11-03: 1000 mg via ORAL
  Filled 2020-11-03: qty 2

## 2020-11-03 MED ORDER — BENZOCAINE-MENTHOL 20-0.5 % EX AERO: 1.0000 "application " | INHALATION_SPRAY | CUTANEOUS | Status: DC | PRN

## 2020-11-03 MED ORDER — OXYTOCIN-SODIUM CHLORIDE 30-0.9 UT/500ML-% IV SOLN
1.0000 m[IU]/min | INTRAVENOUS | Status: DC
  Administered 2020-11-03: 2 m[IU]/min via INTRAVENOUS
  Filled 2020-11-03: qty 500

## 2020-11-03 MED ORDER — ACETAMINOPHEN 500 MG PO TABS
1000.0000 mg | ORAL_TABLET | Freq: Four times a day (QID) | ORAL | Status: DC
  Administered 2020-11-03 – 2020-11-06 (×10): 1000 mg via ORAL
  Filled 2020-11-03 (×10): qty 2

## 2020-11-03 MED ORDER — MAGNESIUM SULFATE 40 GM/1000ML IV SOLN
2.0000 g/h | INTRAVENOUS | Status: AC
  Administered 2020-11-04: 2 g/h via INTRAVENOUS
  Filled 2020-11-03: qty 1000

## 2020-11-03 NOTE — Discharge Instructions (Signed)
Postpartum Care After Vaginal Delivery The following information offers guidance about how to care for yourself from the time you deliver your baby to 6-12 weeks after delivery (postpartum period). If you have problems or questions, contact your health care provider for more specific instructions. Follow these instructions at home: Vaginal bleeding  It is normal to have vaginal bleeding (lochia) after delivery. Wear a sanitary pad for bleeding and discharge. ? During the first week after delivery, the amount and appearance of lochia is often similar to a menstrual period. ? Over the next few weeks, it will gradually decrease to a dry, yellow-brown discharge. ? For most women, lochia stops completely by 4-6 weeks after delivery, but can vary.  Change your sanitary pads frequently. Watch for any changes in your flow, such as: ? A sudden increase in volume. ? A change in color. ? Large blood clots.  If you pass a blood clot from your vagina, save it and call your health care provider. Do not flush blood clots down the toilet before talking with your health care provider.  Do not use tampons or douches until your health care provider approves.  If you are not breastfeeding, your period should return 6-8 weeks after delivery. If you are feeding your baby breast milk only, your period may not return until you stop breastfeeding. Perineal care  Keep the area between the vagina and the anus (perineum) clean and dry. Use medicated pads and pain-relieving sprays and creams as directed.  If you had a surgical cut in the perineum (episiotomy) or a tear, check the area for signs of infection until you are healed. Check for: ? More redness, swelling, or pain. ? Fluid or blood coming from the cut or tear. ? Warmth. ? Pus or a bad smell.  You may be given a squirt bottle to use instead of wiping to clean the perineum area after you use the bathroom. Pat the area gently to dry it.  To relieve pain  caused by an episiotomy, a tear, or swollen veins in the anus (hemorrhoids), take a warm sitz bath 2-3 times a day. In a sitz bath, the warm water should only come up to your hips and cover your buttocks.   Breast care  In the first few days after delivery, your breasts may feel heavy, full, and uncomfortable (breast engorgement). Milk may also leak from your breasts. Ask your health care provider about ways to help relieve the discomfort.  If you are breastfeeding: ? Wear a bra that supports your breasts and fits well. Use breast pads to absorb milk that leaks. ? Keep your nipples clean and dry. Apply creams and ointments as told. ? You may have uterine contractions every time you breastfeed for up to several weeks after delivery. This helps your uterus return to its normal size. ? If you have any problems with breastfeeding, notify your health care provider or lactation consultant.  If you are not breastfeeding: ? Avoid touching your breasts. Do not squeeze out (express) milk. Doing this can make your breasts produce more milk. ? Wear a good-fitting bra and use cold packs to help with swelling. Intimacy and sexuality  Ask your health care provider when you can engage in sexual activity. This may depend upon: ? Your risk of infection. ? How fast you are healing. ? Your comfort and desire to engage in sexual activity.  You are able to get pregnant after delivery, even if you have not had your period. Talk with   your health care provider about methods of birth control (contraception) or family planning if you desire future pregnancies. Medicines  Take over-the-counter and prescription medicines only as told by your health care provider.  Take an over-the-counter stool softener to help ease bowel movements as told by your health care provider.  If you were prescribed an antibiotic medicine, take it as told by your health care provider. Do not stop taking the antibiotic even if you start to  feel better.  Review all previous and current prescriptions to check for possible transfer into breast milk. Activity  Gradually return to your normal activities as told by your health care provider.  Rest as much as possible. Nap while your baby is sleeping. Eating and drinking  Drink enough fluid to keep your urine pale yellow.  To help prevent or relieve constipation, eat high-fiber foods every day.  Choose healthy eating to support breastfeeding or weight loss goals.  Take your prenatal vitamins until your health care provider tells you to stop.   General tips/recommendations  Do not use any products that contain nicotine or tobacco. These products include cigarettes, chewing tobacco, and vaping devices, such as e-cigarettes. If you need help quitting, ask your health care provider.  Do not drink alcohol, especially if you are breastfeeding.  Do not take medications or drugs that are not prescribed to you, especially if you are breastfeeding.  Visit your health care provider for a postpartum checkup within the first 3-6 weeks after delivery.  Complete a comprehensive postpartum visit no later than 12 weeks after delivery.  Keep all follow-up visits for you and your baby. Contact a health care provider if:  You feel unusually sad or worried.  Your breasts become red, painful, or hard.  You have a fever or other signs of an infection.  You have bleeding that is soaking through one pad an hour or you have blood clots.  You have a severe headache that doesn't go away or you have vision changes.  You have nausea and vomiting and are unable to eat or drink anything for 24 hours. Get help right away if:  You have chest pain or difficulty breathing.  You have sudden, severe leg pain.  You faint or have a seizure.  You have thoughts about hurting yourself or your baby. If you ever feel like you may hurt yourself or others, or have thoughts about taking your own life,  get help right away. Go to your nearest emergency department or:  Call your local emergency services (911 in the [place]  The National Suicide Prevention Lifeline at 930-608-4940. This suicide crisis helpline is open 24 hours a day.  Text the Crisis Text Line at (587) 116-5096 (in the [place] Summary  The period of time after you deliver your newborn up to 6-12 weeks after delivery is called the postpartum period.  Keep all follow-up visits for you and your baby.  Review all previous and current prescriptions to check for possible transfer into breast milk.  Contact a health care provider if you feel unusually sad or worried during the postpartum period. This information is not intended to replace advice given to you by your health care provider. Make sure you discuss any questions you have with your health care provider. Document Revised: 05/26/2020 Document Reviewed: 05/26/2020 Elsevier Patient Education  2021 [employer].   Intrauterine Device Insertion, Care After This sheet gives you information about how to care for yourself after your procedure. Your health care provider may also  give you more specific instructions. If you have problems or questions, contact your health care provider. What can I expect after the procedure? After the procedure, it is common to have:  Cramps and pain in the abdomen.  Bleeding. It may be light or heavy. This may last for a few days.  Lower back pain.  Dizziness.  Headaches.  Nausea. Follow these instructions at home:  Before resuming sexual activity, check to make sure that you can feel the IUD string or strings. You should be able to feel the end of the string below the opening of your cervix. If your IUD string is in place, you may resume sexual activity. ? If you had a hormonal IUD inserted more than 7 days after your most recent period started, you will need to use a backup method of birth control for 7 days after IUD insertion. Ask your  health care provider whether this applies to you.  Continue to check that the IUD is still in place by feeling for the strings after every menstrual period, or once a month.  An IUD will not protect you from sexually transmitted infections (STIs). Use methods to prevent the exchange of body fluids between partners (barrier protection) every time you have sex. Barrier protection can be used during oral, vaginal, or anal sex. Commonly used barrier methods include: ? Female condom. ? Female condom. ? Dental dam.  Take over-the-counter and prescription medicines only as told by your health care provider.  Keep all follow-up visits as told by your health care provider. This is important.   Contact a health care provider if:  You feel light-headed or weak.  You have any of the following problems with your IUD string or strings: ? The string bothers or hurts you or your sexual partner. ? You cannot feel the string. ? The string has gotten longer.  You can feel the IUD in your vagina.  You think you may be pregnant, or you miss your menstrual period.  You think you may have a sexually transmitted infection (STI). Get help right away if:  You have flu-like symptoms, such as tiredness (fatigue) and muscle aches.  You have a fever and chills.  You have bleeding that is heavier or lasts longer than a normal menstrual cycle.  You have abnormal or bad-smelling discharge from your vagina.  You develop abdominal pain that is new, is getting worse, or is not in the same area of earlier cramping and pain.  You have pain during sexual activity. Summary  After the procedure, it is common to have cramps and pain in the abdomen. It is also common to have light bleeding or heavier bleeding that is like your menstrual period.  Continue to check that the IUD is still in place by feeling for the strings after every menstrual period, or once a month.  Keep all follow-up visits as told by your health  care provider. This is important.  Contact your health care provider if you have problems with your IUD strings, such as the string getting longer or bothering you or your sexual partner. This information is not intended to replace advice given to you by your health care provider. Make sure you discuss any questions you have with your health care provider. Document Revised: 09/01/2019 Document Reviewed: 09/01/2019 Elsevier Patient Education  2021 Elsevier Inc.  

## 2020-11-03 NOTE — Discharge Summary (Signed)
Postpartum Discharge Summary  Date of Service updated 11/06/2020  Patient Name: Molly Thornton DOB: 05-Apr-1986 MRN: [id number]  Date of admission: 11/02/2020 Delivery date:11/03/2020  Delivering provider: Lenna Sciara  Date of discharge: 11/06/2020  Admitting diagnosis: Severe preeclampsia [O14.10] Preeclampsia, severe [O14.10] Intrauterine pregnancy: [redacted]w[redacted]d    Secondary diagnosis:  Active Problems:   Opiate abuse, continuous (HDowling   Gestational diabetes   Hx of preeclampsia, prior pregnancy, currently pregnant   Preeclampsia, severe   Encounter for IUD insertion   Vaginal delivery  Additional problems: as noted above  Discharge diagnosis: Term Vaginal Delivery                   Post partum procedures:post-placental Liletta IUD Augmentation: AROM, Pitocin and Cytotec Complications: None  Hospital course: Induction of Labor With Vaginal Delivery   35y.o. yo [id number] admitted to the hospital 11/02/2020 for induction of labor.  Indication for induction: Preeclampsia with Severe Features. Magnesium was initiated on admission and continued for 24 hours in postpartum period. Patient had a labor course as follows: Membrane Rupture Time/Date: 1:55 PM ,11/03/2020   Delivery Method:Vaginal, Spontaneous  Episiotomy: None  Lacerations:  None  Details of delivery can be found in separate delivery note. Post-placental Liletta IUD placed s/p delivery. Patient had a routine postpartum course. Patient is discharged home 11/06/20. She will resume methadone treatment through her clinic on 11/07/20. Newborn Data: Birth date:11/03/2020  Birth time:5:02 PM  Gender:Female  Living status:Living  Apgars:6 ,8  Weight:2780 g   Magnesium Sulfate received: Yes: Seizure prophylaxis BMZ received: No Rhophylac:N/A MMR:N/A T-DaP:Given prenatally Flu: offered prior to discharge Transfusion:No  Physical exam  Vitals:   11/05/20 1949 11/05/20 1950 11/05/20 2326 11/06/20 0529  BP: 123/78   124/76 124/81  Pulse: 70  70 (!) 59  Resp: 18  18   Temp: 98.7 F (37.1 C)  98.6 F (37 C) 98.3 F (36.8 C)  TempSrc: Axillary  Oral Oral  SpO2: 97% 97% 98% 97%  Weight:      Height:       General: alert, cooperative and no distress Lochia: appropriate Uterine Fundus: firm Incision: N/A DVT Evaluation: No evidence of DVT seen on physical exam. No cords or calf tenderness. Labs: Lab Results  Component Value Date   WBC 13.4 (H) 11/05/2020   HGB 10.8 (L) 11/05/2020   HCT 30.0 (L) 11/05/2020   MCV 87.2 11/05/2020   PLT 195 11/05/2020   CMP Latest Ref Rng & Units 11/05/2020  Glucose 70 - 99 mg/dL 72  BUN 6 - 20 mg/dL 13  Creatinine 0.44 - 1.00 mg/dL 0.94  Sodium 135 - 145 mmol/L 139  Potassium 3.5 - 5.1 mmol/L 4.8  Chloride 98 - 111 mmol/L 109  CO2 22 - 32 mmol/L 20(L)  Calcium 8.9 - 10.3 mg/dL 8.9  Total Protein 6.5 - 8.1 g/dL 5.7(L)  Total Bilirubin 0.3 - 1.2 mg/dL 0.3  Alkaline Phos 38 - 126 U/L 69  AST 15 - 41 U/L 23  ALT 0 - 44 U/L 17   Edinburgh Score: No flowsheet data found.   After visit meds:  Allergies as of 11/06/2020      Reactions   Hydrocodone Hives   Ivp Dye [iodinated Diagnostic Agents] Rash      Medication List    STOP taking these medications   Comfort Fit Maternity Supp Med Misc   cyclobenzaprine 10 MG tablet Commonly known as: FLEXERIL   metoCLOPramide 10 MG  tablet Commonly known as: REGLAN     TAKE these medications   acetaminophen 500 MG tablet Commonly known as: TYLENOL Take 1,000 mg by mouth every 6 (six) hours as needed for mild pain or headache.   albuterol 108 (90 Base) MCG/ACT inhaler Commonly known as: VENTOLIN HFA Inhale 2 puffs into the lungs every 6 (six) hours as needed for wheezing or shortness of breath.   Blood Pressure Kit Devi 1 kit by Does not apply route once a week.   ibuprofen 600 MG tablet Commonly known as: ADVIL Take 1 tablet (600 mg total) by mouth every 6 (six) hours.   methadone 10 MG/ML  solution Commonly known as: DOLOPHINE Take 165 mg by mouth daily.   Vitafol Ultra 29-0.6-0.4-200 MG Caps Take 1 capsule by mouth daily before breakfast.        Discharge home in stable condition Infant Feeding: Bottle Infant Disposition:NICU Discharge instruction: per After Visit Summary and Postpartum booklet. Activity: Advance as tolerated. Pelvic rest for 6 weeks.  Diet: routine diet Future Appointments: Future Appointments  Date Time Provider [place]  11/09/2020  1:45 PM Chancy Milroy, MD [place] None  11/09/2020  3:30 PM [facility] NURSE [facility]  11/09/2020  3:45 PM [facility] US5 [facility] [place]   Follow up Visit:  [place]. Schedule an appointment as soon as possible for a visit in 4 week(s).   Specialty: Obstetrics and Gynecology Why: postpartum appointment, she will also need a blood pressure check in 1 week Contact information: [address], [place] [phone]             Message sent to [facility] clinic on 11/04/20 to schedule PP appts.  Please schedule this patient for a In person postpartum visit in 6 weeks with the following provider: MD. Additional Postpartum F/U:Postpartum Depression checkup and BP check 1 week  High risk pregnancy complicated by: severe preeclampsia, polysubstance use, prescribed methadone in pregnancy, tobacco use, chronic hepatitis C, anxiety/depression Delivery mode:  Vaginal, Spontaneous  Anticipated Birth Control:  PP IUD placed   11/06/2020 Griffin Basil, MD

## 2020-11-03 NOTE — Progress Notes (Signed)
Labor Progress Note Molly Thornton is a 35 y.o. [id number] at [redacted]w[redacted]d presented for IOL S: Pain with contractions   O:  BP 127/83 (BP Location: Right Arm)   Pulse 79   Temp 98.9 F (37.2 C)   Resp 17   Wt 82.7 kg   LMP 02/13/2020 (Exact Date) Comment: had unusual bleeding  twice in Jan  SpO2 99%   BMI 28.54 kg/m  EFM: baseline 130/mod variability/pos accels/no decels  CVE: Dilation: 3 Effacement (%): 50 Cervical Position: Posterior Station: -2 Presentation: Vertex Exam by:: Carlyn Reichert   A&P: 35 y.o. [id number] [redacted]w[redacted]d here for IOL for PEC w SF.   #Labor: Progressing well. S/p FB and cytotec. Will transition to pitocin at this time.  #Suspected PEC w SF: Normotensive, however with new onset proteinuria at outside lab, headache w scotoma, new edema. Hx of PreE. On mag. Remains normotensive. Repeat P:C here is normal (.14). Labs normal.   #Substance Use Disorder: stable, on methadone. UDS neg (will not show methadone, confirmatory test sent). 165mg  methadone daily ordered. Prescription monitoring database reviewed. SW consult pp.  #Tobacco Use Disorder: nicotine replacement therapy prn    #Pain: desires epidural now  #FWB: cat I  #GBS neg by pcr.      , MD 5:34 AM

## 2020-11-03 NOTE — Progress Notes (Signed)
Molly Thornton is a 35 y.o. [id number] at [redacted]w[redacted]d admitted for pre-eclampsia with severe features.    Subjective: Headache improved. No concerns at this time.  Objective: BP (!) 93/50   Pulse 91   Temp 99.6 F (37.6 C) (Axillary)   Resp 16   Ht 5\' 7"  (1.702 m)   Wt 82.6 kg   LMP 02/13/2020 (Exact Date) Comment: had unusual bleeding  twice in Jan  SpO2 99%   BMI 28.52 kg/m  Total I/O In: 2044.3 [P.O.:470; I.V.:1574.3] Out: 1025 [Urine:1025]  FHT:  FHR: 145 bpm, variability: minimal-moderate,  accelerations:  Present,  decelerations:  Intermittent variable decels UC: every 3-5 min  SVE:   Dilation: 7 Effacement (%): 80 Station: 0 Exam by:: Dr [email or web address]  Pitocin @ 22 mu/min  Labs: Lab Results  Component Value Date   WBC 10.8 (H) 11/03/2020   HGB 12.2 11/03/2020   HCT 36.6 11/03/2020   MCV 90.4 11/03/2020   PLT 199 11/03/2020    Assessment / Plan: 35 y.o. 31 at [redacted]w[redacted]d admitted for pre-eclampsia with severe features.    #Labor: Progressing well. S/p FB and cytotec. Now s/p AROM with good fetal tolerance of up-titration of pitocin. #Pain: epidural in place #FWB: Cat II given intermittent variable decels with brief periods of minimal variability. Reassuringly, +accels with overall moderate variability. Will continue to monitor closely. #GBS: Negative by PCR on admission #Suspected PEC w SF: Normotensive, however with new onset proteinuria at outside lab, headache w scotoma, new edema.  Hx of PreE. On mag.  Remains normotensive.  Repeat P:C here is normal (.14).  HA improved with Fioricet this morning. Will continue to monitor. #Substance Use Disorder: Stable, on methadone.  UDSneg (will not show methadone, confirmatory test sent).  165mg  methadone daily ordered.  Prescription monitoring database reviewed.  SW consult pp.  #Tobacco Use Disorder: Nicotine replacement therapy prn  [redacted]w[redacted]d, MD 11/03/2020, 3:36 PM

## 2020-11-03 NOTE — Anesthesia Preprocedure Evaluation (Signed)
Anesthesia Evaluation  Patient identified by MRN, date of birth, ID band Patient awake    Reviewed: Allergy & Precautions, Patient's Chart, lab work & pertinent test results  History of Anesthesia Complications Negative for: history of anesthetic complications  Airway Mallampati: II  TM Distance: >3 FB Neck ROM: Full    Dental no notable dental hx.    Pulmonary asthma , Current Smoker,    Pulmonary exam normal        Cardiovascular hypertension, Normal cardiovascular exam     Neuro/Psych Anxiety Depression Bipolar Disorder negative neurological ROS     GI/Hepatic negative GI ROS, (+)     substance abuse (on methadone)  , Hepatitis -, C  Endo/Other  diabetes, Gestational  Renal/GU Renal InsufficiencyRenal disease  negative genitourinary   Musculoskeletal negative musculoskeletal ROS (+)   Abdominal   Peds  Hematology negative hematology ROS (+)   Anesthesia Other Findings Day of surgery medications reviewed with patient.  Reproductive/Obstetrics (+) Pregnancy (preE on Mg)                             Anesthesia Physical Anesthesia Plan  ASA: III  Anesthesia Plan: Epidural   Post-op Pain Management:    Induction:   PONV Risk Score and Plan: Treatment may vary due to age or medical condition  Airway Management Planned: Natural Airway  Additional Equipment:   Intra-op Plan:   Post-operative Plan:   Informed Consent: I have reviewed the patients History and Physical, chart, labs and discussed the procedure including the risks, benefits and alternatives for the proposed anesthesia with the patient or authorized representative who has indicated his/her understanding and acceptance.       Plan Discussed with:   Anesthesia Plan Comments:         Anesthesia Quick Evaluation

## 2020-11-03 NOTE — Progress Notes (Addendum)
Molly Thornton is a 35 y.o. [id number] at [redacted]w[redacted]d admitted for pre-eclampsia with severe features.    Subjective: Headache has returned.  No other complaints.   Objective: BP 111/72   Pulse 88   Temp (!) 97.5 F (36.4 C) (Axillary)   Resp 16   Ht 5\' 7"  (1.702 m)   Wt 82.6 kg   LMP 02/13/2020 (Exact Date) Comment: had unusual bleeding  twice in Jan  SpO2 99%   BMI 28.52 kg/m  Total I/O In: 659.3 [P.O.:125; I.V.:534.3] Out: 600 [Urine:600]  FHT:  FHR: 135 bpm, variability: moderate,  accelerations:  Present,  decelerations:  Absent UC:   irregular, every 2-3 minutes  SVE:   Dilation: 5 Effacement (%): 70 Station: -3,-2 Exam by:: Dr [email or web address]  Pitocin @ 8 mu/min  Labs: Lab Results  Component Value Date   WBC 10.8 (H) 11/03/2020   HGB 12.2 11/03/2020   HCT 36.6 11/03/2020   MCV 90.4 11/03/2020   PLT 199 11/03/2020    Assessment / Plan: 35 y.o. 31 at [redacted]w[redacted]d admitted for pre-eclampsia with severe features.     #Labor: Progressing well. S/p FB and cytotec. Will transition to pitocin at this time.  #Suspected PEC w SF: Normotensive, however with new onset proteinuria at outside lab, headache w scotoma, new edema.  Hx of PreE. On mag.  Remains normotensive.  Repeat P:C here is normal (.14).  Labs normal.  Minimal improvement of headache with Fioricet, more improvement with mag.  Will try dose of Benadryl and Reglan for headache now. #Substance Use Disorder: Stable, on methadone.  UDSneg (will not show methadone, confirmatory test sent).  165mg  methadone daily ordered.  Prescription monitoring database reviewed.  SW consult pp.  #Tobacco Use Disorder: Nicotine replacement therapy prn #Pain: Desires epidural now  #FWB: Cat I  #GBS: Neg by pcr  Nesreen Albano [redacted]w[redacted]d, MD 11/03/2020, 11:54 AM

## 2020-11-03 NOTE — Lactation Note (Signed)
This note was copied from a baby's chart. Lactation Consultation Note  Patient Name: Molly Thornton Date: 11/03/2020    [facility] checked in with RN, Salley Hews x2 to see if this was a good time to come to L and D. RN states they are working on O2 sats and it would be better for Mom to be seen on the floor.

## 2020-11-03 NOTE — Anesthesia Procedure Notes (Signed)
Epidural Patient location during procedure: OB Start time: 11/03/2020 8:42 AM End time: 11/03/2020 8:45 AM  Staffing Anesthesiologist: Kaylyn Layer, MD Performed: anesthesiologist   Preanesthetic Checklist Completed: patient identified, IV checked, risks and benefits discussed, monitors and equipment checked, pre-op evaluation and timeout performed  Epidural Patient position: sitting Prep: DuraPrep and site prepped and draped Patient monitoring: continuous pulse ox, blood pressure and heart rate Approach: midline Location: L3-L4 Injection technique: LOR air  Needle:  Needle type: Tuohy  Needle gauge: 17 G Needle length: 9 cm Needle insertion depth: 6 cm Catheter type: closed end flexible Catheter size: 19 Gauge Catheter at skin depth: 11 cm Test dose: negative and Other (1% lidocaine)  Assessment Events: blood not aspirated, injection not painful, no injection resistance, no paresthesia and negative IV test  Additional Notes Patient identified. Risks, benefits, and alternatives discussed with patient including but not limited to bleeding, infection, nerve damage, paralysis, failed block, incomplete pain control, headache, blood pressure changes, nausea, vomiting, reactions to medication, itching, and postpartum back pain. Confirmed with bedside nurse the patient's most recent platelet count. Confirmed with patient that they are not currently taking any anticoagulation, have any bleeding history, or any family history of bleeding disorders. Patient expressed understanding and wished to proceed. All questions were answered. Sterile technique was used throughout the entire procedure. Please see nursing notes for vital signs.   Crisp LOR on first pass. Test dose was given through epidural catheter and negative prior to continuing to dose epidural or start infusion. Warning signs of high block given to the patient including shortness of breath, tingling/numbness in hands, complete  motor block, or any concerning symptoms with instructions to call for help. Patient was given instructions on fall risk and not to get out of bed. All questions and concerns addressed with instructions to call with any issues or inadequate analgesia.  Reason for block:procedure for pain

## 2020-11-03 NOTE — Lactation Note (Addendum)
This note was copied from a baby's chart. Lactation Consultation Note  Patient Name: Boy Adiya Veley Today's Date: 11/03/2020   Age:36 hours  (LC services - No Charge) P3, ETI female infant in NICU due hypotonia. Per mom, she attends Clinic in [place] and goes for counseling twice a month.  Mom has past hx of breastfeeding her other two children who are 57 and 11 years for 4 months each.  RN will discuss with Pediatrician tonight  regarding mom pumping and giving infant her EBM while infant is in NICU. 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 Infant is at risk for sedation,  respiratory depression ( breathing difficulties or limpness but the breastfeeding may help with neonatal withdrawal symptoms.  RN will call LC  to help mom with breastfeeding  if okayed by Pediatrician with hand expressing  and pumping.   Maternal Data    Feeding    LATCH Score                    Lactation Tools Discussed/Used    Interventions    Discharge    Consult Status      Danelle Earthly 11/03/2020, 10:56 PM

## 2020-11-04 LAB — STREP GP B NAA: Strep Gp B NAA: NEGATIVE

## 2020-11-04 MED ORDER — METOCLOPRAMIDE HCL 5 MG/5ML PO SOLN: 10.0000 mg | Freq: Four times a day (QID) | ORAL | Status: DC | PRN

## 2020-11-04 MED ORDER — METOCLOPRAMIDE HCL 5 MG/ML IJ SOLN
10.0000 mg | Freq: Once | INTRAMUSCULAR | Status: AC
  Administered 2020-11-04: 10 mg via INTRAVENOUS
  Filled 2020-11-04: qty 2

## 2020-11-04 MED ORDER — DIPHENHYDRAMINE HCL 50 MG/ML IJ SOLN
25.0000 mg | Freq: Once | INTRAMUSCULAR | Status: AC
  Administered 2020-11-04: 25 mg via INTRAVENOUS
  Filled 2020-11-04: qty 1

## 2020-11-04 MED ORDER — LACTATED RINGERS IV SOLN: INTRAVENOUS | Status: DC

## 2020-11-04 MED ORDER — METOCLOPRAMIDE HCL 5 MG/ML IJ SOLN
10.0000 mg | Freq: Four times a day (QID) | INTRAMUSCULAR | Status: DC | PRN
  Administered 2020-11-04 – 2020-11-05 (×2): 10 mg via INTRAVENOUS
  Filled 2020-11-04 (×2): qty 2

## 2020-11-04 MED ORDER — DIPHENHYDRAMINE HCL 50 MG/ML IJ SOLN
25.0000 mg | Freq: Four times a day (QID) | INTRAMUSCULAR | Status: DC | PRN
  Administered 2020-11-04 – 2020-11-05 (×2): 25 mg via INTRAVENOUS
  Filled 2020-11-04 (×2): qty 1

## 2020-11-04 NOTE — Progress Notes (Signed)
Post Partum Day 1 Subjective: no complaints, up ad lib, tolerating PO and Wants her Foley to be discontinued as it is bothering her  Objective:  Intake/Output Summary (Last 24 hours) at 11/04/2020 0952 Last data filed at 11/04/2020 0931 Gross per 24 hour  Intake 4324.43 ml  Output 5175 ml  Net -850.57 ml   Vitals:   11/04/20 0627 11/04/20 0747 11/04/20 0810 11/04/20 0915  BP: (!) 101/59 106/61    Pulse: 79 85    Temp: 98 F (36.7 C) 98.1 F (36.7 C)    Resp: 16 16 18 17   Height:      Weight:      SpO2: 98% 99%    TempSrc: Oral Oral    BMI (Calculated):        Physical Exam:  General: alert, cooperative and appears stated age 35: appropriate Uterine Fundus: firm DVT Evaluation: No evidence of DVT seen on physical exam.  Recent Labs    11/03/20 0748 11/03/20 1808  HGB 12.2 13.2  HCT 36.6 37.5    Assessment/Plan: Plan for discharge tomorrow, Social Work consult, Circumcision prior to discharge and Contraception pp IUD Magnesium sulfate for 24 hours postpartum. Her blood pressures are very well controlled we will hold on additional medications at this time.  LOS: 2 days   01/01/21 11/04/2020, 9:50 AM

## 2020-11-04 NOTE — Lactation Note (Signed)
This note was copied from a baby's chart. Lactation Consultation Note  Patient Name: Molly Thornton Today's Date: 11/04/2020 Reason for consult: NICU baby;Follow-up assessment Age:36 hours  LC met with mother in infant's NICU room. Mother is pumping but has not collected colostrum yet. LC reviewed normalcy and encouraged hand expression; HE taught at this visit. Will plan f/u visit.  Maternal Data Has patient been taught Hand Expression?: Yes  Feeding Nipple Type: Other (wide dr brown premie)   Consult Status Consult Status: Follow-up Follow-up type: In-patient   Lisa R Miller, MA IBCLC 11/04/2020, 5:46 PM    

## 2020-11-04 NOTE — Plan of Care (Signed)
  Problem: Coping: Goal: Level of anxiety will decrease Outcome: Progressing   Problem: Pain Managment: Goal: General experience of comfort will improve Outcome: Progressing   Problem: Activity: Goal: Ability to tolerate increased activity will improve Outcome: Progressing

## 2020-11-04 NOTE — Anesthesia Postprocedure Evaluation (Signed)
Anesthesia Post Note  Patient: Molly Thornton  Procedure(s) Performed: AN AD HOC LABOR EPIDURAL     Patient location during evaluation: OB High Risk Anesthesia Type: Epidural Level of consciousness: awake and alert Pain management: pain level controlled Vital Signs Assessment: post-procedure vital signs reviewed and stable Respiratory status: spontaneous breathing Cardiovascular status: stable Postop Assessment: no headache, adequate PO intake, no backache, patient able to bend at knees, able to ambulate, epidural receding and no apparent nausea or vomiting Anesthetic complications: no   No complications documented.  Last Vitals:  Vitals:   11/04/20 0600 11/04/20 0627  BP:  (!) 101/59  Pulse:  79  Resp: 16 16  Temp:  36.7 C  SpO2:  98%    Last Pain:  Vitals:   11/04/20 0645  TempSrc:   PainSc: Asleep   Pain Goal: Patients Stated Pain Goal: 3 (11/03/20 1920)                 Salome Arnt

## 2020-11-05 ENCOUNTER — Encounter (HOSPITAL_COMMUNITY): Payer: Self-pay | Admitting: Obstetrics & Gynecology

## 2020-11-05 LAB — COMPREHENSIVE METABOLIC PANEL
ALT: 17 U/L (ref 0–44)
AST: 23 U/L (ref 15–41)
Albumin: 2.2 g/dL — ABNORMAL LOW (ref 3.5–5.0)
Alkaline Phosphatase: 69 U/L (ref 38–126)
Anion gap: 10 (ref 5–15)
BUN: 13 mg/dL (ref 6–20)
CO2: 20 mmol/L — ABNORMAL LOW (ref 22–32)
Calcium: 8.9 mg/dL (ref 8.9–10.3)
Chloride: 109 mmol/L (ref 98–111)
Creatinine, Ser: 0.94 mg/dL (ref 0.44–1.00)
GFR, Estimated: >60 mL/min (ref >60)
Glucose, Bld: 72 mg/dL (ref 70–99)
Potassium: 4.8 mmol/L (ref 3.5–5.1)
Sodium: 139 mmol/L (ref 135–145)
Total Bilirubin: 0.3 mg/dL (ref 0.3–1.2)
Total Protein: 5.7 g/dL — ABNORMAL LOW (ref 6.5–8.1)

## 2020-11-05 LAB — CBC
HCT: 30 % — ABNORMAL LOW (ref 36.0–46.0)
Hemoglobin: 10.8 g/dL — ABNORMAL LOW (ref 12.0–15.0)
MCH: 31.4 pg (ref 26.0–34.0)
MCHC: 36 g/dL (ref 30.0–36.0)
MCV: 87.2 fL (ref 80.0–100.0)
Platelets: 195 10*3/uL (ref 150–400)
RBC: 3.44 MIL/uL — ABNORMAL LOW (ref 3.87–5.11)
RDW: 15 % (ref 11.5–15.5)
WBC: 13.4 10*3/uL — ABNORMAL HIGH (ref 4.0–10.5)
nRBC: 0 % (ref 0.0–0.2)

## 2020-11-05 NOTE — Clinical Social Work Maternal (Signed)
CLINICAL SOCIAL WORK MATERNAL/CHILD NOTE  Patient Details  Name: Molly Thornton MRN: [id number] Date of Birth: 12-08-1985  Date:  11/05/2020  Clinical Social Worker Initiating Note:  Laurey Arrow Date/Time: Initiated:  11/05/20/1411     Child's Name:  Molly Thornton   Biological Parents:  Mother,Father   Need for Interpreter:  None   Reason for Referral:  Behavioral Health Concerns,Current Substance Use/Substance Use During Pregnancy    Address:  [address] [place] [address]    Phone number:  [phone] (home)     Additional phone number: FOB's number is [phone]  Household Members/Support Persons (HM/SP):   Household Member/Support Person 1,Household Member/Support Person 2,Household Member/Support Person 3 (MOB reported her older to children is in the custody of her her mother ([place]) and MOB pays her child support. CPS was not involved with custody arrangement.)   HM/SP Name Relationship DOB or Age  HM/SP -1 Riley Nearing Thornton FOB 03/20/1984  HM/SP -[place] daughter Arvil Chaco  HM/SP -3 son 5//4/10    HM/SP -4        HM/SP -5        HM/SP -6        HM/SP -7        HM/SP -8          Natural Supports (not living in the home):  Immediate Family,Parent (Per MOB, FOB's family will provide support if needed.)   Professional Supports: Designer, industrial/product (Comment)   Employment: Full-time,Part-time   Type of Work: [employer] reportst she works full time at [employer] and PT at [employer].   Education:  Some Therapist, occupational arranged:    Museum/gallery curator Resources:  Medicaid   Other Resources:  Physicist, medical ,[place]   Cultural/Religious Considerations Which May Impact Care:  None reported  Strengths:  Ability to meet basic needs ,Home prepared for child ,Psychotropic Medications,Understanding of illness (CSW provided MOB with a local peds list.)   Psychotropic Medications:  Methadone      Pediatrician:       Pediatrician List:    [place]      Pediatrician Fax Number:    Risk Factors/Current Problems:  Substance Use ,Mental Health Concerns    Cognitive State:  Alert ,Insightful ,Goal Oriented ,Linear Thinking    Mood/Affect:  Comfortable ,Interested ,Happy ,Relaxed    CSW Assessment: CSW met with MOB in room 108 to complete an assessment for hx of substance abuse and MH hx. When CSW arrived, MOB was resting in bed watching TV and FOB was sitting on th couch watching TV. CSW explained CSW's role and MOB gave CSW permission to ask FOB to leave in order to assess MOB in private; FOB left without incident.   MOB was forthcoming, polite, and easy to engage.   CSW inquired about MOB's substance use and MOB reported being compliant with her Methadone treatment since pregnancy confirmation. However, MOB shared she did take a pill that was not prescribed to her and smoked some marijuana in October (2021).MOB reported that her father was dying and his condition "Was a trigger for me and I relapsed."  CSW thanked MOB for being honest and forthcoming. MOB communicated that she has since been working with her therapist with [facility] to learn new coping strategies to respond to her triggers. . CSW praised MOB and encouraged her  to stay in her sobriety. Per MOB, she goes to [facility] daily for her Methadone dosing and plans to communicate with her provider about decreasing her dosage since she has delivered. CSW informed MOB of the hospital's drug screen policy. CSW was made aware of the 2 drug screenings for the infant.  MOB was understanding however asked several questions about CPS. CSW informed MOB that the infant's UDS was negative and  CSW will monitor infant's CDS and will make a report to [facility] if warranted. MOB stated, "I have worked too hard and I have too much to lose, so  I know I don't want to use drugs again. CSW praised MOB for her insight and awareness.   CSW asked about MOB's MH hx.  MOB acknowledged a hx of anxiety, depression, and PTSD however she denied the dx of Bipolar disorder. MOB shared that she use to take medication(Wellbutrin), but has since discontinued. CSW provided education regarding the baby blues period vs. perinatal mood disorders, discussed treatment and gave resources for mental health follow up if concerns arise.  CSW recommends self-evaluation during the postpartum time period using the New Mom Checklist from Postpartum Progress and encouraged MOB to contact a medical professional if symptoms are noted at any time. MOB presented with insight and awareness and denied PPD symptoms with her older 2 children. CSW reported feeling comfortable seeking help and she denied SI, HI, and DV when assessed for safety.   CSW provided review of Sudden Infant Death Syndrome (SIDS) precautions.    MOB reports having a good support team and feeling well informed by NICU team.   MOB also shared that the couple are currently leaving in a hotel but is receiving resources and supports from [employer].   Per MOB, the couple has all essential items to care for infant including a new car seat, co-sleeper, bassinet, and crib.   CSW will continue to offer resources and supports to family while infant remains in NICU.  CSW thanked MOB for meeting with CSW and provided MOB with CSW's contact information.     CSW Plan/Description:  Psychosocial Support and Ongoing Assessment of Needs,Sudden Infant Death Syndrome (SIDS) Education,Perinatal Mood and Anxiety Disorder (PMADs) Education,Neonatal Abstinence Syndrome (NAS) Education,Other Patient/Family Education,Hospital Drug Screen Policy Information,Other Information/Referral to [employer] Will Continue to Monitor Umbilical Cord Tissue Drug Screen Results and Make Report if Warranted   Laurey Arrow, MSW, LCSW Clinical Social Work [phone]   Dimple Nanas, LCSW 11/05/2020, 2:27 PM

## 2020-11-05 NOTE — Lactation Note (Signed)
This note was copied from a baby's chart. Lactation Consultation Note  Patient Name: Molly Thornton WYOVZ'C Date: 11/05/2020 Reason for consult: Follow-up assessment;NICU baby;Early term 37-38.6wks;Infant weight loss Age:36 hours  Visited with mom of 36 hours old ETI NICU female, she's a P3. LC attempted to visit mother in her room but she wasn't there, she was in the NICU. Mom and baby doing STS when LC arrived to baby's room, praised her for her efforts. Baby is currently on Similac 24 calorie formula and at 9% weight loss.  Mom reported that she still hasn't gotten "anything" with pumping. Explained to mom that the purpose of pumping early on is mainly for breast stimulation and not to get volume. Reviewed pumping schedule, lactogenesis II, prevention/treatment of sore nipples; mom has a Hx of hepatitis C but she reported she was cured last year.  LC instructed mom to start using coconut oil prior each pumping session (RN Tolley notified, she'll bring it to mom's room) in order to present dryness/shafting and potential cracking/bleeding. She's aware that hep (+) BF mom should not BF their babies when their nipples are bleeding, will F/U with provider.   Feeding plan:  1. Encouraged mom to continue pumping every 2-3 hours, at least 8 pumping sessions/24 hours 2. She'll apply coconut on nipple/areola complex prior pumping  No support person other than MOB at the time of [facility] consultation. Mom reported all questions and concerns were answered, she's aware of LC OP services and will call PRN.   Maternal Data    Feeding Mother's Current Feeding Choice: Breast Milk and Formula  LATCH Score                    Lactation Tools Discussed/Used Tools: Pump;Coconut oil Breast pump type: Double-Electric Breast Pump Pump Education: Setup, frequency, and cleaning Reason for Pumping: ETI in NICU Pumping frequency: q 2-3 hours Pumped volume: 0 mL  Interventions Interventions:  Breast feeding basics reviewed;DEBP;Skin to skin;Coconut oil (adjusted junctures on Medela DEBP, they're loose)  Discharge Pump: DEBP  Consult Status Consult Status: Follow-up Date: 11/06/20 Follow-up type: In-patient    Chidera Thivierge Venetia Constable 11/05/2020, 2:35 PM

## 2020-11-05 NOTE — Progress Notes (Signed)
POSTPARTUM PROGRESS NOTE  Post Partum Day 2  Subjective:  Delphia Luther is a 35 y.o. [id number] s/p SVD at [redacted]w[redacted]d secondary to severe preeclampsia.  She reports she is doing well. No acute events overnight. She denies any problems with ambulating, voiding or po intake.  The patient is s/p magnesium sulfate therapy Denies nausea or vomiting.  Pain is well controlled.  Lochia is minimal.  Objective: Blood pressure 120/80, pulse 70, temperature 98.6 F (37 C), temperature source Oral, resp. rate 16, height 5\' 7"  (1.702 m), weight 82.6 kg, last menstrual period 02/13/2020, SpO2 98 %, unknown if currently breastfeeding.  Physical Exam:  General: alert, cooperative and no distress Chest: no respiratory distress Heart:regular rate, distal pulses intact Abdomen: soft, nontender,  Uterine Fundus: firm, appropriately tender DVT Evaluation: No calf swelling or tenderness Extremities: minimal edema Skin: warm, dry  Recent Labs    11/03/20 0748 11/03/20 1808  HGB 12.2 13.2  HCT 36.6 37.5    Assessment/Plan: Catlynn Lewing is a 35 y.o. 31 s/p SVD at [redacted]w[redacted]d for severe preeclampsia  PPD#2 - Doing well  Routine postpartum care No increase in BP Will check with NICU to see if infant can be circumcised due to blood sugar issues D/C in am on 2/13- pt usually receives weekend methadone Satur[place] morning.  She has missed this dispensation.  Pt will receive her Sunday dose and then be discharged on 2/13  LOS: 3 days   3/13, MD Faculty attending 11/05/2020, 10:57 AM

## 2020-11-06 ENCOUNTER — Ambulatory Visit: Payer: Self-pay

## 2020-11-06 MED ORDER — IBUPROFEN 600 MG PO TABS: 600.0000 mg | ORAL_TABLET | Freq: Four times a day (QID) | ORAL | 2 refills | Status: AC

## 2020-11-06 NOTE — Lactation Note (Signed)
This note was copied from a baby's chart. Lactation Consultation Note  Patient Name: Molly Thornton Today's Date: 11/06/2020 Reason for consult: NICU baby;Follow-up assessment;Early term 37-38.6wks Age:35 years  Follow up visit to P3 mother of 36 hours old preterm currently in NICU. Mother was discharged home today. Mother states pumping is going well and last pumping session she collected ~30mL. Mother has not been pumping at night. Discussed the importance of pumping 8-12 times in 24 h including night time. Talked about breast massage prior to pumping. Mother explains breasts are a little sore and she has noticed some changes. LC informed breast changes are consistent with beginning lactogenesis II and pumping is going to be very important. Reviewed pumping with maintenance setting, paying attention to let down in order to empty breasts completely.  Mother requests pumping kit to pump at bedside, she does not have hers available at this time.  Mother is a WIC participant at [place] during this pregnancy. Discussed making a referral for DEBP from WIC. Father of Infant is present and states he bought a DEBP for mother to use at home.   Plan: 1-Pump using maintenance setting 8-12 times x 24h for breast stimulation 2-Promoted maternal self care 3-Contact LC as needed for feeds/support/concerns/questions   Feeding Mother's Current Feeding Choice: Breast Milk and Formula  Lactation Tools Discussed/Used Tools: Pump;Flanges Flange Size: 24 Breast pump type: Double-Electric Breast Pump;Manual Pump Education: Setup, frequency, and cleaning;Milk Storage Reason for Pumping: maternal infant separation Pumping frequency: 8-12 x 24h Pumped volume: 30 mL (last pumping session @7am 2/13)  Interventions Interventions: DEBP;Hand pump;Expressed milk;Education  Discharge Discharge Education: Engorgement and breast care Pump: DEBP;Manual;Personal WIC Program: Yes  Consult  Status Consult Status: Follow-up Date: 11/07/20 Follow-up type: In-patient    Linels A Higuera Ancidey 11/06/2020, 10:31 AM    

## 2020-11-06 NOTE — Progress Notes (Signed)
POSTPARTUM PROGRESS NOTE  Post Partum Day 3  Subjective:  Molly Thornton is a 35 y.o. [id number] s/p spontaneous vaginal delivery at [redacted]w[redacted]d.  She reports she is doing well. No acute events overnight. She denies any problems with ambulating, voiding or po intake. Denies nausea or vomiting.  Pain is well controlled.  Lochia is minimal.  Discharge was held to receive weekend methadone dose as he cannot receive it from her clinic today.  Objective: Blood pressure 124/81, pulse (!) 59, temperature 98.3 F (36.8 C), temperature source Oral, resp. rate 18, height 5\' 7"  (1.702 m), weight 82.6 kg, last menstrual period 02/13/2020, SpO2 97 %, unknown if currently breastfeeding.  Physical Exam:  General: alert, cooperative and no distress Chest: no respiratory distress Heart:regular rate, distal pulses intact Abdomen: soft, nontender,  Uterine Fundus: firm, appropriately tender DVT Evaluation: No calf swelling or tenderness Extremities: minimal edema Skin: warm, dry  Recent Labs    11/03/20 1808 11/05/20 1100  HGB 13.2 10.8*  HCT 37.5 30.0*    Assessment/Plan: Molly Thornton is a 35 y.o. 31 s/p SVD at [redacted]w[redacted]d   PPD#3 - Doing well Discharge home  LOS: 4 days   [redacted]w[redacted]d, MD Faculty attending 11/06/2020, 7:05 AM

## 2020-11-07 ENCOUNTER — Ambulatory Visit: Payer: Self-pay

## 2020-11-07 NOTE — Lactation Note (Signed)
This note was copied from a baby's chart. Lactation Consultation Note  Patient Name: Molly Thornton Today's Date: 11/07/2020   Age:36 days  Melanee Left of [facility] called and left [place] a message stating that one of their [facility] clients had a baby in the NICU and Mom had exposure to cocaine, opiates, and benzodiazepines, and marijuana during pregnancy. I called Erin back and received clarification that this was discovered by the [facility] RN, Pearson Forster. Mom had reported using the above plus marijuana during pregnancy prior to switching to methadone.  I made Rosie Fate, NNP aware of the above.     Lurline Hare [facility] 11/07/2020, 1:14 PM

## 2020-11-09 ENCOUNTER — Encounter: Payer: Medicaid Other | Admitting: Obstetrics and Gynecology

## 2020-11-09 ENCOUNTER — Ambulatory Visit: Payer: Medicaid Other

## 2020-11-10 ENCOUNTER — Ambulatory Visit: Payer: Medicaid Other

## 2020-11-14 ENCOUNTER — Institutional Professional Consult (permissible substitution): Payer: Medicaid Other | Admitting: Licensed Clinical Social Worker

## 2020-11-18 ENCOUNTER — Ambulatory Visit: Payer: Self-pay

## 2020-11-18 NOTE — Lactation Note (Signed)
This note was copied from a baby's chart. Lactation Consultation Note  Patient Name: Molly Thornton Date: 11/18/2020    Age:36 wk.o.  RN called for assistance, mom has been taking baby to breast the last 2 days on and off, baby is no longer on scheduled feedings but feeding ad lib. He's currently on breastmilk with added Similac 24 calorie formula Mom is requesting an LC feeding assist for tomorrow after 3 pm, she gets off work at 2 pm.   Maternal Data    Feeding Nipple Type: Other (DB wide base premie)  LATCH Score                    Lactation Tools Discussed/Used    Interventions    Discharge    Consult Status      Sheniece Ruggles Venetia Constable 11/18/2020, 7:18 PM

## 2020-11-19 ENCOUNTER — Ambulatory Visit: Payer: Self-pay

## 2020-11-19 NOTE — Lactation Note (Signed)
This note was copied from a baby's chart. Lactation Consultation Note  Patient Name: Molly Thornton DGLOV'F Date: 11/19/2020 Reason for consult: Follow-up assessment Age:36 wk.o.  Mother seen today for follow up visit to assist with breastfeeding. Upon entry into room mother informed that he was just finishing up a feeding via bottle. She stated that she began bringing infant to breast since he has been on ad lib feeding. She stated that he was latching well. She informs that she continues to pump q3h and with 4 hours interval at night. Stated that she some times get a half of bottle", asked if there was ways to stabilize supply. LC and LC student reassured mother that she was doing the right things to support her milk supply. She states that she was using 41mm flanges but was having some discomfort she she opted to using the 62mm flanges. They are more comfortable. She continues to use coconut oil.   LC and LC student educated mother on power pumping, hand on pumping (with hands free bra), resting and good nutrition to aid with milk supply.   LC and LC student will return to fit nothing for belly band to aid in hands free pumping. Infant was about to be circumcised.   Plan: - Encouraged mother to hold infant STS as much as possible - Continue to pump q3h for 15 minutes - Continue to get rest and good nutrition   Lactation Tools Discussed/Used Tools: Flanges Flange Size: 27 Breast pump type: Double-Electric Breast Pump Pump Education: Setup, frequency, and cleaning;Milk Storage Reason for Pumping:  (Supplement BF and stimulatate milk supply) Pumping frequency: q3h  Interventions Interventions: Breast feeding basics reviewed;Skin to skin;Breast compression;Expressed milk;Coconut oil;DEBP;Education  Discharge    Consult Status Consult Status: Complete    Zola Button 11/19/2020, 3:53 PM

## 2020-11-21 ENCOUNTER — Institutional Professional Consult (permissible substitution): Payer: Medicaid Other | Admitting: Licensed Clinical Social Worker

## 2020-11-28 ENCOUNTER — Telehealth: Payer: Self-pay | Admitting: Licensed Clinical Social Worker

## 2020-11-28 ENCOUNTER — Encounter: Payer: Medicaid Other | Admitting: Licensed Clinical Social Worker

## 2020-11-28 NOTE — Telephone Encounter (Signed)
Called Ms. Roughton regarding scheduled mychart visit. Left message for callback

## 2020-12-15 ENCOUNTER — Ambulatory Visit: Payer: Medicaid Other | Admitting: Obstetrics and Gynecology

## 2021-01-04 ENCOUNTER — Ambulatory Visit (INDEPENDENT_AMBULATORY_CARE_PROVIDER_SITE_OTHER): Payer: Medicaid Other | Admitting: Women's Health

## 2021-01-04 ENCOUNTER — Other Ambulatory Visit: Payer: Self-pay

## 2021-01-04 VITALS — BP 117/78 | HR 89 | Ht 67.0 in | Wt 180.0 lb

## 2021-01-04 DIAGNOSIS — N898 Other specified noninflammatory disorders of vagina: Secondary | ICD-10-CM | POA: Diagnosis not present

## 2021-01-04 DIAGNOSIS — Z30431 Encounter for routine checking of intrauterine contraceptive device: Secondary | ICD-10-CM | POA: Diagnosis not present

## 2021-01-04 NOTE — Patient Instructions (Signed)
Levonorgestrel intrauterine device (IUD) What is this medicine? LEVONORGESTREL IUD (LEE voe nor jes trel) is a contraceptive (birth control) device. The device is placed inside the uterus by a health care provider. It is used to prevent pregnancy. Some devices can also be used to treat heavy bleeding that occurs during your period. This medicine may be used for other purposes; ask your health care provider or pharmacist if you have questions. COMMON BRAND NAME(S): Kyleena, LILETTA, Mirena, Skyla What should I tell my health care provider before I take this medicine? They need to know if you have any of these conditions:  abnormal Pap smear  cancer of the breast, uterus, or cervix  diabetes  endometritis  genital or pelvic infection now or in the past  have more than one sexual partner or your partner has more than one partner  heart disease  history of an ectopic or tubal pregnancy  immune system problems  IUD in place  liver disease or tumor  problems with blood clots or take blood-thinners  seizures  use intravenous drugs  uterus of unusual shape  vaginal bleeding that has not been explained  an unusual or allergic reaction to levonorgestrel, other hormones, silicone, or polyethylene, medicines, foods, dyes, or preservatives  pregnant or trying to get pregnant  breast-feeding How should I use this medicine? This device is placed inside the uterus by a health care professional. Talk to your pediatrician regarding the use of this medicine in children. Special care may be needed. Overdosage: If you think you have taken too much of this medicine contact a poison control center or emergency room at once. NOTE: This medicine is only for you. Do not share this medicine with others. What if I miss a dose? This does not apply. Depending on the brand of device you have inserted, the device will need to be replaced every 3 to 7 years if you wish to continue using this type  of birth control. What may interact with this medicine? Do not take this medicine with any of the following medications:  amprenavir  bosentan  fosamprenavir This medicine may also interact with the following medications:  aprepitant  armodafinil  barbiturate medicines for inducing sleep or treating seizures  bexarotene  boceprevir  griseofulvin  medicines to treat seizures like carbamazepine, ethotoin, felbamate, oxcarbazepine, phenytoin, topiramate  modafinil  pioglitazone  rifabutin  rifampin  rifapentine  some medicines to treat HIV infection like atazanavir, efavirenz, indinavir, lopinavir, nelfinavir, tipranavir, ritonavir  St. John's wort  warfarin This list may not describe all possible interactions. Give your health care provider a list of all the medicines, herbs, non-prescription drugs, or dietary supplements you use. Also tell them if you smoke, drink alcohol, or use illegal drugs. Some items may interact with your medicine. What should I watch for while using this medicine? Visit your doctor or health care professional for regular check ups. See your doctor if you or your partner has sexual contact with others, becomes HIV positive, or gets a sexual transmitted disease. This product does not protect you against HIV infection (AIDS) or other sexually transmitted diseases. You can check the placement of the IUD yourself by reaching up to the top of your vagina with clean fingers to feel the threads. Do not pull on the threads. It is a good habit to check placement after each menstrual period. Call your doctor right away if you feel more of the IUD than just the threads or if you cannot feel the threads   at all. The IUD may come out by itself. You may become pregnant if the device comes out. If you notice that the IUD has come out use a backup birth control method like condoms and call your health care provider. Using tampons will not change the position of the  IUD and are okay to use during your period. This IUD can be safely scanned with magnetic resonance imaging (MRI) only under specific conditions. Before you have an MRI, tell your healthcare provider that you have an IUD in place, and which type of IUD you have in place. What side effects may I notice from receiving this medicine? Side effects that you should report to your doctor or health care professional as soon as possible:  allergic reactions like skin rash, itching or hives, swelling of the face, lips, or tongue  fever, flu-like symptoms  genital sores  high blood pressure  no menstrual period for 6 weeks during use  pain, swelling, warmth in the leg  pelvic pain or tenderness  severe or sudden headache  signs of pregnancy  stomach cramping  sudden shortness of breath  trouble with balance, talking, or walking  unusual vaginal bleeding, discharge  yellowing of the eyes or skin Side effects that usually do not require medical attention (report to your doctor or health care professional if they continue or are bothersome):  acne  breast pain  change in sex drive or performance  changes in weight  cramping, dizziness, or faintness while the device is being inserted  headache  irregular menstrual bleeding within first 3 to 6 months of use  nausea This list may not describe all possible side effects. Call your doctor for medical advice about side effects. You may report side effects to FDA at 1-800-FDA-1088. Where should I keep my medicine? This does not apply. NOTE: This sheet is a summary. It may not cover all possible information. If you have questions about this medicine, talk to your doctor, pharmacist, or health care provider.  2021 Elsevier/Gold Standard (2020-05-10 16:27:45)  

## 2021-01-04 NOTE — Progress Notes (Signed)
Post Partum Visit Note  Molly Thornton is a 35 y.o. [id number] female who presents for a postpartum visit. She is 8 weeks postpartum following a normal spontaneous vaginal delivery.  I have fully reviewed the prenatal and intrapartum course. The delivery was at 37 gestational weeks and she was induced for severe preeclampsia.  Anesthesia: epidural. Postpartum course has been unremarkable. Baby is doing well. Baby is feeding by both breast and bottle - Similac Neosure. Bleeding staining only. Bowel function is normal. Bladder function is normal. Patient is sexually active. Contraception method is IUD. Postpartum depression screening: negative. EPDS =0   Patient reports vaginal discharge with odor for about one week.     Edinburgh Postnatal Depression Scale - 01/04/21 1517      Edinburgh Postnatal Depression Scale:  In the Past 7 Days   I have been able to laugh and see the funny side of things. 0    I have looked forward with enjoyment to things. 0    I have blamed myself unnecessarily when things went wrong. 0    I have been anxious or worried for no good reason. 0    I have felt scared or panicky for no good reason. 0    Things have been getting on top of me. 0    I have been so unhappy that I have had difficulty sleeping. 0    I have felt sad or miserable. 0    I have been so unhappy that I have been crying. 0    The thought of harming myself has occurred to me. 0    Edinburgh Postnatal Depression Scale Total 0          There are no preventive care reminders to display for this patient.  The following portions of the patient's history were reviewed and updated as appropriate: allergies, current medications, past family history, past medical history, past social history, past surgical history and problem list.  Review of Systems Pertinent items noted in HPI and remainder of comprehensive ROS otherwise negative.  Objective:  BP 117/78   Pulse 89   Ht 5\' 7"  (1.702 m)   Wt 180  lb (81.6 kg)   LMP 02/13/2020 (Exact Date) Comment: had unusual bleeding  twice in Jan  BMI 28.19 kg/m    General:  alert, cooperative and no distress   Breasts:  deferred  Abdomen: soft, non-tender; bowel sounds normal; no masses,  no organomegaly   Wound {n/a  GU exam:  IUD strings not visible and not able to be trimmed, will send for Feb for IUD location, small amount of frothy discharge noted on exam without odor.       Assessment:   1. Vaginal discharge - Cervicovaginal ancillary only( [place])  2. IUD check up - US PELVIC COMPLETE WITH TRANSVAGINAL; Future  3. Postpartum care and examination -normal  Plan:   Essential components of care per ACOG recommendations:  1.  Mood and well being: Patient with negative depression screening today. Reviewed local resources for support.  - Patient does not use tobacco. 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 Infant care and feeding:  -Patient currently breastmilk feeding? Yes  If breastmilk feeding discussed return to work and pumping. If needed, patient was provided letter for work to allow for every 2-3 hr pumping breaks, and to be granted a private location to express breastmilk and refrigerated area to store breastmilk. Reviewed importance of draining breast regularly  to support lactation. -Social determinants of health (SDOH) reviewed in EPIC.  3. Sexuality, contraception and birth spacing - Patient does not want a pregnancy in the next year.  Desired family size is 3 children.  - Reviewed forms of contraception in tiered fashion. Patient desired IUD today.  4. Sleep and fatigue -Encouraged family/partner/community support of 4 hrs of uninterrupted sleep to help with mood and fatigue  5. Physical Recovery  - Discussed patients delivery and complications - Patient had a Vaginal, no problems at delivery. Perineal healing reviewed. Patient expressed understanding - Patient has urinary incontinence? No  -  Patient is safe to resume physical and sexual activity  6.  Health Maintenance - Last pap smear done 05/17/2020 and was normal with negative HPV. Pap smear not done at today's visit.   7. Chronic Disease - PCP follow up - pt reports she has a PCP  Marylen Ponto, NP [facility] [employer], [facility]

## 2021-01-05 LAB — CERVICOVAGINAL ANCILLARY ONLY
Bacterial Vaginitis (gardnerella): POSITIVE — AB
Candida Glabrata: NEGATIVE
Candida Vaginitis: NEGATIVE
Chlamydia: NEGATIVE
Comment: NEGATIVE
Comment: NEGATIVE
Comment: NEGATIVE
Comment: NEGATIVE
Comment: NEGATIVE
Comment: NORMAL
Neisseria Gonorrhea: NEGATIVE
Trichomonas: NEGATIVE

## 2021-01-09 ENCOUNTER — Other Ambulatory Visit: Payer: Self-pay | Admitting: Women's Health

## 2021-01-09 MED ORDER — METRONIDAZOLE 500 MG PO TABS: 500.0000 mg | ORAL_TABLET | Freq: Two times a day (BID) | ORAL | 0 refills | Status: DC

## 2021-01-23 ENCOUNTER — Other Ambulatory Visit: Payer: Self-pay

## 2021-01-23 ENCOUNTER — Ambulatory Visit
Admission: RE | Admit: 2021-01-23 | Discharge: 2021-01-23 | Disposition: A | Discharge status: Home / Self Care | Payer: Medicaid Other | Source: Ambulatory Visit | Attending: Women's Health | Admitting: Women's Health

## 2021-01-23 DIAGNOSIS — Z30431 Encounter for routine checking of intrauterine contraceptive device: Secondary | ICD-10-CM | POA: Insufficient documentation

## 2021-01-26 ENCOUNTER — Telehealth: Payer: Self-pay | Admitting: Women's Health

## 2021-01-26 NOTE — Telephone Encounter (Signed)
Patient called and identified by two identifiers. Patient informed of [place] results to locate IUD and informed IUD is in appropriate, fundal position. Discussed removal options for IUD should strings continue to not be visible on pelvic exam. Patient also confirmed history of Essure procedure that was performed approximately 11 years ago and has since failed. Patient questions asked and answered, patient verbalizes understanding of findings. Patient thought that one of her Essure wires was missing, but discussed that Essure was noted bilaterally on [place]. Patient given phone number for [facility] to call for clarification on Essure findings, if desired.  Marylen Ponto, NP  11:12 AM 01/26/2021

## 2021-04-30 IMAGING — US US MFM OB DETAIL+14 WK
1 series · 14 of 28 positions shown · non-contrast
Comparison: none

[Series 1: us mfm ob detail+14 wk · 94 acquisitions, 14 frames shown]
[im 4/94]
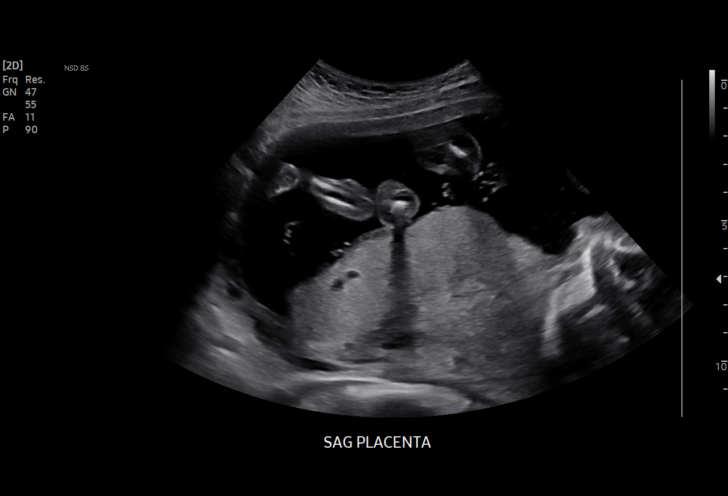
[im 11/94]
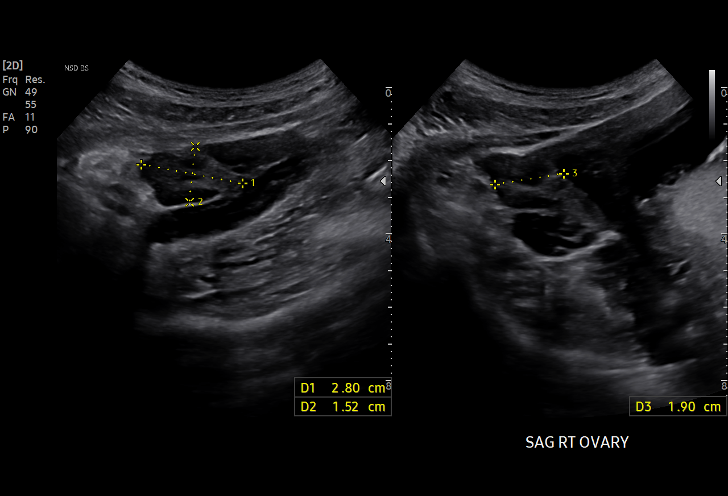
[im 18/94]
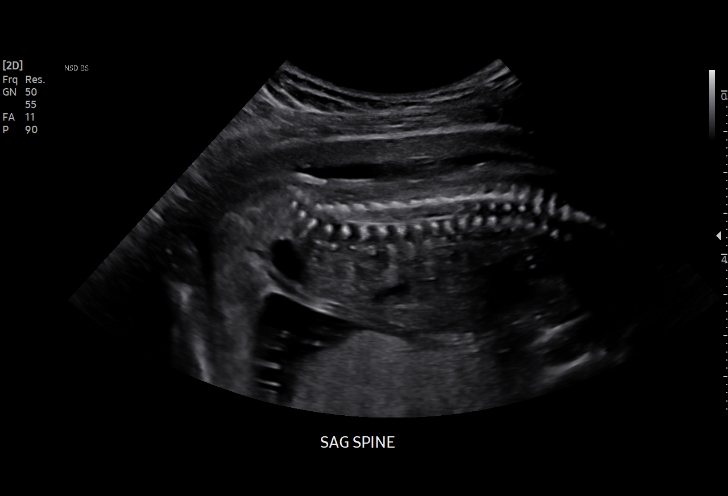
[im 25/94]
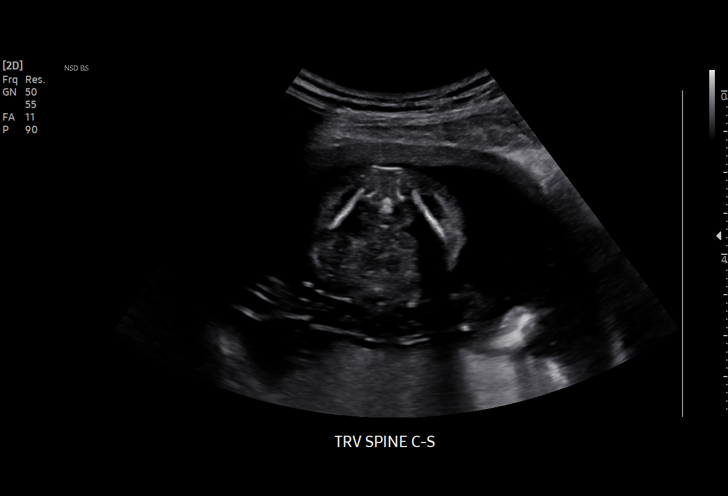
[im 32/94]
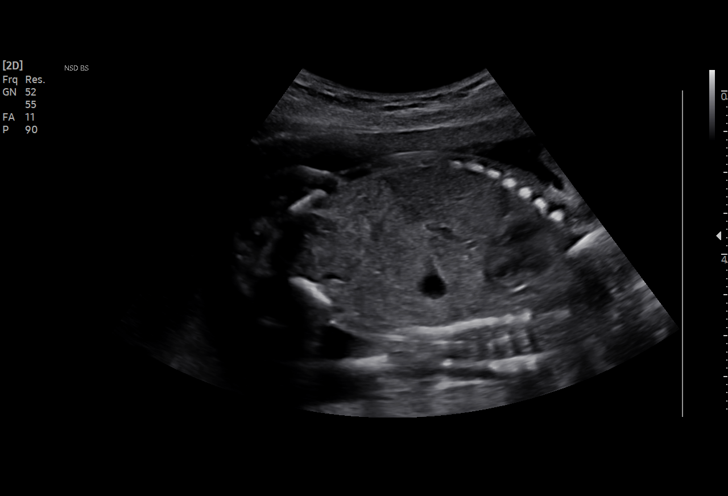
[im 38/94]
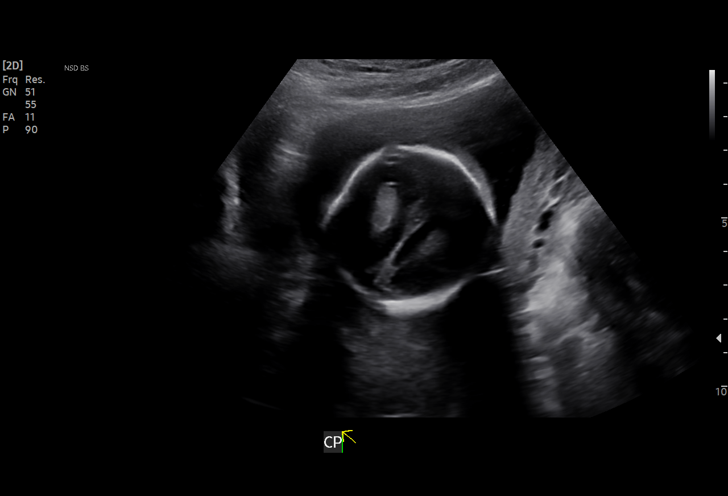
[im 45/94]
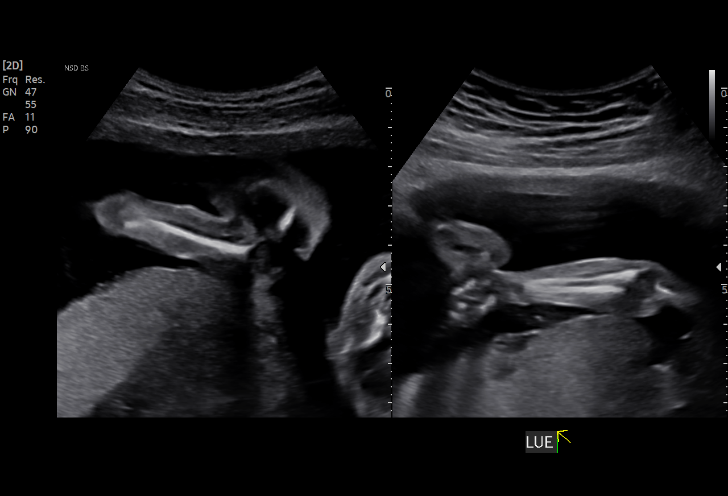
[im 52/94]
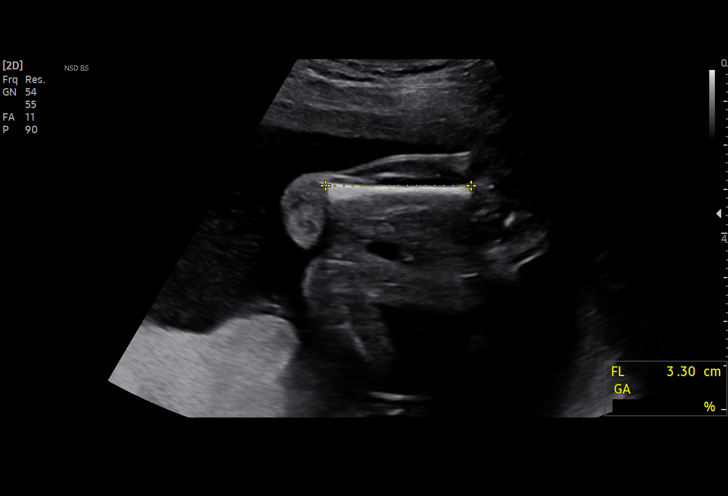
[im 59/94]
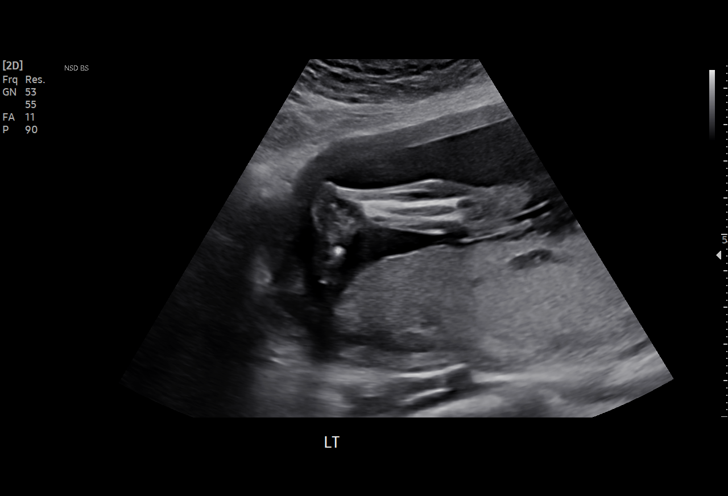
[im 66/94]
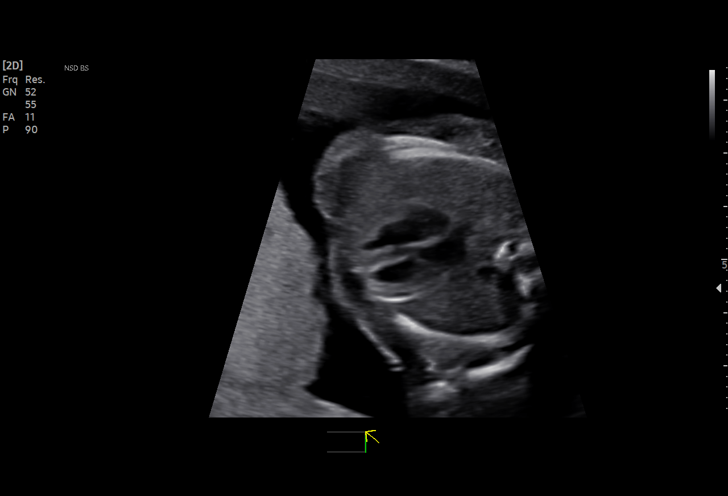
[im 73/94]
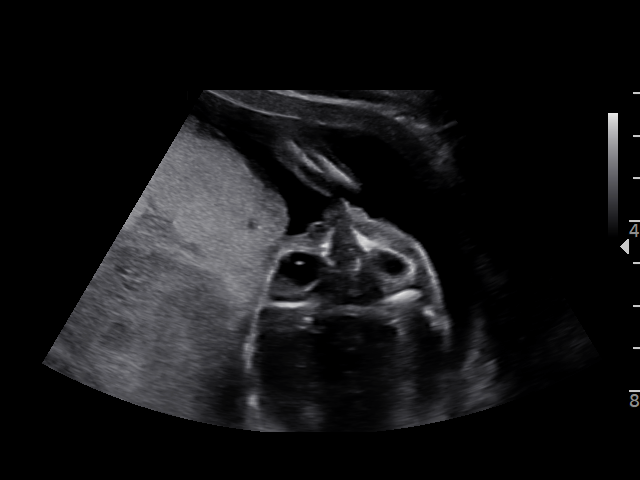
[im 80/94]
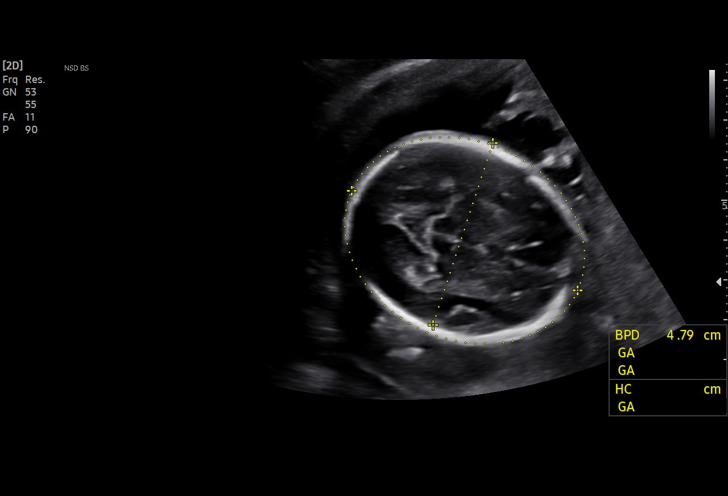
[im 87/94]
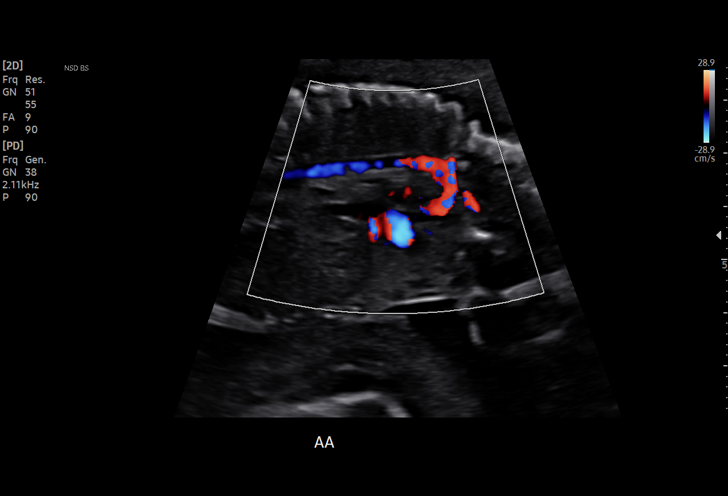
[im 94/94]
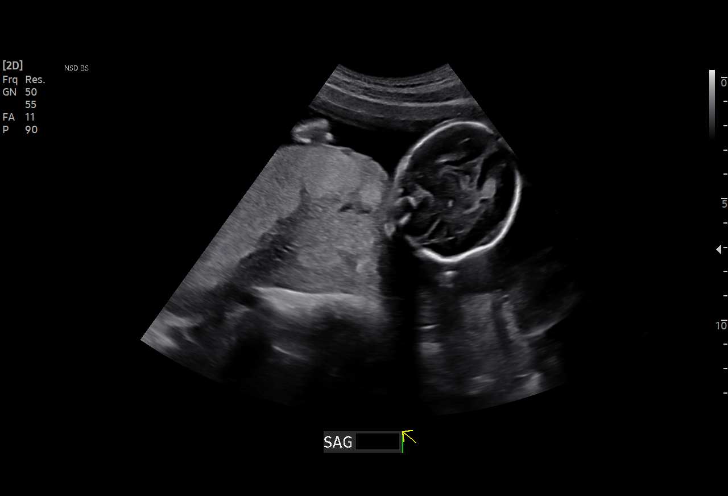

[14 of 28 positions shown; findings below may reference images not displayed]

Indications

 High risk pregnancy due to maternal drug
 abuse (Currently on Methadone)
 20 weeks gestation of pregnancy
 Advanced maternal age multigravida 35+,
 second trimester
 Encounter for antenatal screening for
 malformations (LR NIPS, Neg [place])
Fetal Evaluation

 Num Of Fetuses:         1
 Fetal Heart Rate(bpm):  160
 Cardiac Activity:       Observed
 Presentation:           Cephalic
 Placenta:               Posterior
 P. Cord Insertion:      Visualized

 Amniotic Fluid
 AFI FV:      Within normal limits

                             Largest Pocket(cm)

Biometry

 BPD:      47.5  mm     G. Age:  20w 3d         40  %    CI:        73.82   %    70 - 86
                                                         FL/HC:      18.6   %    15.9 -
 HC:      175.6  mm     G. Age:  20w 0d         20  %    HC/AC:      1.15        1.06 -
 AC:      153.3  mm     G. Age:  20w 4d         42  %    FL/BPD:     68.8   %
 FL:       32.7  mm     G. Age:  20w 1d         29  %    FL/AC:      21.3   %    20 - 24
 HUM:      32.1  mm     G. Age:  20w 5d         54  %
 NFT:       4.0  mm

 LV:        6.8  mm
 CM:          5  mm

 Est. FW:     348  gm    0 lb 12 oz      33  %
OB History

 Gravidity:    5         Term:   2        Prem:   0        SAB:   1
 TOP:          1       Ectopic:  0        Living: 2
Gestational Age

 LMP:           20w 4d        Date:  02/13/20                 EDD:   11/19/20
 U/S Today:     20w 2d                                        EDD:   11/21/20
 Best:          20w 4d     Det. By:  LMP  (02/13/20)          EDD:   11/19/20
Anatomy

 Cranium:               Appears normal         Aortic Arch:            Appears normal
 Cavum:                 Appears normal         Ductal Arch:            Appears normal
 Ventricles:            Appears normal         Diaphragm:              Appears normal
 Choroid Plexus:        Appears normal         Stomach:                Appears normal, left
                                                                       sided
 Posterior Fossa:       Appears normal         Abdomen:                Appears normal
 Nuchal Fold:           Not applicable (>20    Abdominal Wall:         Appears nml (cord
                        wks GA)                                        insert, abd wall)
 Face:                  Orbits nl; profile not Cord Vessels:           Appears normal (3
                        well visualized                                vessel cord)
 Lips:                  Appears normal         Kidneys:                Appear normal
 Palate:                Appears normal         Bladder:                Appears normal
 Thoracic:              Appears normal         Spine:                  Appears normal
 Heart:                 Appears normal         Upper Extremities:      Appears normal
                        (4CH, axis, and
                        situs)
 RVOT:                  Appears normal         Lower Extremities:      Appears normal
 LVOT:                  Appears normal

 Other:  Fetus appears to be a male. Heels visualized. Technically difficult due
         to fetal position.
Cervix Uterus Adnexa

 Cervix
 Length:           3.03  cm.
 Normal appearance by transabdominal scan.

 Right Ovary
 Within normal limits.

 Left Ovary
 Not visualized.

 Adnexa
 No abnormality visualized.
Impression

 Follow up growth due to suboptimal anatomy and substance
 abuse.
 Normal interval growth with measurements consistent with
 dates
 Good fetal movement and amniotic fluid volume
 Suboptimal views of the fetal anatomy was again seen.
 Given exposures we recommend follow up growth in 8 weeks
Recommendations

 Follow up growth in 8 weeks.

## 2021-06-20 ENCOUNTER — Ambulatory Visit (INDEPENDENT_AMBULATORY_CARE_PROVIDER_SITE_OTHER): Payer: Medicaid Other | Admitting: Obstetrics

## 2021-06-20 ENCOUNTER — Other Ambulatory Visit (HOSPITAL_COMMUNITY)
Admission: RE | Admit: 2021-06-20 | Discharge: 2021-06-20 | Disposition: A | Discharge status: Home / Self Care | Payer: Medicaid Other | Source: Ambulatory Visit | Attending: Obstetrics | Admitting: Obstetrics

## 2021-06-20 ENCOUNTER — Encounter: Payer: Self-pay | Admitting: Obstetrics

## 2021-06-20 ENCOUNTER — Other Ambulatory Visit: Payer: Self-pay

## 2021-06-20 VITALS — BP 114/63 | HR 80 | Ht 67.0 in | Wt 203.6 lb

## 2021-06-20 DIAGNOSIS — N764 Abscess of vulva: Secondary | ICD-10-CM | POA: Diagnosis not present

## 2021-06-20 DIAGNOSIS — R635 Abnormal weight gain: Secondary | ICD-10-CM

## 2021-06-20 DIAGNOSIS — Z30431 Encounter for routine checking of intrauterine contraceptive device: Secondary | ICD-10-CM

## 2021-06-20 DIAGNOSIS — Z0000 Encounter for general adult medical examination without abnormal findings: Secondary | ICD-10-CM

## 2021-06-20 DIAGNOSIS — N898 Other specified noninflammatory disorders of vagina: Secondary | ICD-10-CM | POA: Diagnosis present

## 2021-06-20 DIAGNOSIS — Z01419 Encounter for gynecological examination (general) (routine) without abnormal findings: Secondary | ICD-10-CM | POA: Insufficient documentation

## 2021-06-20 DIAGNOSIS — F1911 Other psychoactive substance abuse, in remission: Secondary | ICD-10-CM

## 2021-06-20 MED ORDER — IBUPROFEN 800 MG PO TABS
800.0000 mg | ORAL_TABLET | Freq: Three times a day (TID) | ORAL | 5 refills | Status: AC | PRN
Start: 2021-06-20 — End: ?

## 2021-06-20 MED ORDER — METRONIDAZOLE 500 MG PO TABS: 500.0000 mg | ORAL_TABLET | Freq: Two times a day (BID) | ORAL | 2 refills | Status: DC

## 2021-06-20 MED ORDER — DOXYCYCLINE HYCLATE 100 MG PO CAPS: 100.0000 mg | ORAL_CAPSULE | Freq: Two times a day (BID) | ORAL | 0 refills | Status: DC

## 2021-06-20 NOTE — Addendum Note (Signed)
Addended by: Marya Landry D on: 06/20/2021 02:20 PM   Modules accepted: Orders

## 2021-06-20 NOTE — Progress Notes (Signed)
Subjective:        Molly Thornton is a 35 y.o. female here for a routine exam.  Current complaints: Recent undesired and unexplained weight gain.  Also c/o vaginal discharge..    Personal health questionnaire:  Is patient Ashkenazi Jewish, have a family history of breast and/or ovarian cancer: no Is there a family history of uterine cancer diagnosed at age < 74, gastrointestinal cancer, urinary tract cancer, family member who is a Field seismologist syndrome-associated carrier: no Is the patient overweight and hypertensive, family history of diabetes, personal history of gestational diabetes, preeclampsia or PCOS: no Is patient over 67, have PCOS,  family history of premature CHD under age 50, diabetes, smoke, have hypertension or peripheral artery disease:  no At any time, has a partner hit, kicked or otherwise hurt or frightened you?: no Over the past 2 weeks, have you felt down, depressed or hopeless?: no Over the past 2 weeks, have you felt little interest or pleasure in doing things?:no   Gynecologic History No LMP recorded. (Menstrual status: IUD). Contraception: IUD Last Pap: 05-17-2020. Results were: normal Last mammogram: n/a. Results were: n/a  Obstetric History OB History  Gravida Para Term Preterm AB Living  _0 SAB IAB Ectopic Multiple Live Births  1 1   0 3    # Outcome Date GA Lbr Len/2nd Weight Sex Delivery Anes PTL Lv  5 Term 11/03/20 25w5d22:30 / 00:32 6 lb 2.1 oz (2.78 kg) M Vag-Spont EPI  LIV  4 SAB 11/2019          3 IAB 04/02/09          2 Term 01/25/09    M Vag-Spont   LIV  1 Term 09/04/07    F Vag-Spont   LIV    Past Medical History:  Diagnosis Date   Anxiety    Asthma    Bipolar disorder ([place]    Chronic kidney disease    Depression    Hepatitis C    "never tx'd" (05/02/2018)   Migraine    "daily" (05/02/2018)   PTSD (post-traumatic stress disorder)    Sickle cell trait ([place]     Past Surgical History:  Procedure Laterality Date   ESSURE  TUBAL LIGATION     WISDOM TOOTH EXTRACTION       Current Outpatient Medications:    furosemide (LASIX) 20 MG tablet, Take 20 mg by mouth daily., Disp: , Rfl:    losartan (COZAAR) 25 MG tablet, Take 25 mg by mouth daily., Disp: , Rfl:    methadone (DOLOPHINE) 10 MG/ML solution, Take 125 mg by mouth daily., Disp: , Rfl:    acetaminophen (TYLENOL) 500 MG tablet, Take 1,000 mg by mouth every 6 (six) hours as needed for mild pain or headache. (Patient not taking: Reported on 06/20/2021), Disp: , Rfl:    albuterol (VENTOLIN HFA) 108 (90 Base) MCG/ACT inhaler, Inhale 2 puffs into the lungs every 6 (six) hours as needed for wheezing or shortness of breath. (Patient not taking: Reported on 06/20/2021), Disp: , Rfl:    Blood Pressure Monitoring (BLOOD PRESSURE KIT) DEVI, 1 kit by Does not apply route once a week. (Patient not taking: Reported on 06/20/2021), Disp: 1 each, Rfl: 0   ibuprofen (ADVIL) 600 MG tablet, Take 1 tablet (600 mg total) by mouth every 6 (six) hours. (Patient not taking: No sig reported), Disp: 30 tablet, Rfl: 2   metroNIDAZOLE (FLAGYL) 500 MG tablet, Take 1 tablet (  500 mg total) by mouth 2 (two) times daily. (Patient not taking: Reported on 06/20/2021), Disp: 14 tablet, Rfl: 0   Prenat-Fe Poly-Methfol-FA-DHA (VITAFOL ULTRA) 29-0.6-0.4-200 MG CAPS, Take 1 capsule by mouth daily before breakfast. (Patient not taking: Reported on 06/20/2021), Disp: 90 capsule, Rfl: 4 Allergies  Allergen Reactions   Hydrocodone Hives   Ivp Dye [Iodinated Diagnostic Agents] Rash    Social History   Tobacco Use   Smoking status: Every Day    Packs/day: 0.25    Years: 17.00    Pack years: 4.25    Types: Cigarettes   Smokeless tobacco: Never  Substance Use Topics   Alcohol use: Not Currently    Comment: 05/02/2018 "nothing since 2013"    Family History  Problem Relation Age of Onset   Heart disease Father    Hypertension Father    Asthma Brother    Diabetes Mother    Pancreatitis Mother     Coronary artery disease Mother    Neuropathy Mother       Review of Systems  Constitutional: negative for fatigue and weight loss Respiratory: negative for cough and wheezing Cardiovascular: negative for chest pain, fatigue and palpitations Gastrointestinal: negative for abdominal pain and change in bowel habits Musculoskeletal:negative for myalgias Neurological: negative for gait problems and tremors Behavioral/Psych: negative for abusive relationship, depression Endocrine: negative for temperature intolerance    Genitourinary: positive for vaginal discharge and vulva boil.  negative for abnormal menstrual periods, genital lesions, hot flashes, sexual problems  Integument/breast: negative for breast lump, breast tenderness, nipple discharge and skin lesion(s)    Objective:       BP 114/63   Pulse 80   Ht _0  (1.702 m)   Wt 203 lb 9.6 oz (92.4 kg)   Breastfeeding No   BMI 31.89 kg/m  General:   Alert and no distress  Skin:   no rash or abnormalities  Lungs:   clear to auscultation bilaterally  Heart:   regular rate and rhythm, S1, S2 normal, no murmur, click, rub or gallop  Breasts:   normal without suspicious masses, skin or nipple changes or axillary nodes  Abdomen:  normal findings: no organomegaly, soft, non-tender and no hernia  Pelvis:  External genitalia: normal general appearance Urinary system: urethral meatus normal and bladder without fullness, nontender Vaginal: normal without tenderness, induration or masses Cervix: normal appearance.  IUD string visible Adnexa: normal bimanual exam Uterus: anteverted and non-tender, normal size   Lab Review Urine pregnancy test Labs reviewed yes Radiologic studies reviewed no  I have spent a total of 20 minutes of face-to-face time, excluding clinical staff time, reviewing notes and preparing to see patient, ordering tests and/or medications, and counseling the patient.   Assessment:    1. Encounter for gynecological  examination with Papanicolaou smear of cervix Rx: - Cytology - PAP( [place])  2. Vaginal discharge Rx: - Cervicovaginal ancillary only( [place])  3. Boil of vulva Rx: - doxycycline (VIBRAMYCIN) 100 MG capsule; Take 1 capsule (100 mg total) by mouth 2 (two) times daily.  Dispense: 14 capsule; Refill: 0 - metroNIDAZOLE (FLAGYL) 500 MG tablet; Take 1 tablet (500 mg total) by mouth 2 (two) times daily.  Dispense: 14 tablet; Refill: 2 - ibuprofen (ADVIL) 800 MG tablet; Take 1 tablet (800 mg total) by mouth every 8 (eight) hours as needed.  Dispense: 30 tablet; Refill: 5  4. 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 IUD check up - doing  well  6. Recent weight gain - unexplained, managed by PCP     Plan:    Education reviewed: calcium supplements, depression evaluation, low fat, low cholesterol diet, safe sex/STD prevention, self breast exams, smoking cessation, and weight bearing exercise. Follow up in: 1 year.     Shelly Bombard, MD 06/20/2021 10:35 AM

## 2021-06-20 NOTE — Progress Notes (Signed)
Pt is in the office for annual. Last pap 05-17-2020 Pt currently has IUD, placed in Feb after delivery, pt would like to discuss weight gain. Pt reports possible ingrown hairs bumps on labia.

## 2021-06-21 LAB — CERVICOVAGINAL ANCILLARY ONLY
Bacterial Vaginitis (gardnerella): NEGATIVE
Candida Glabrata: NEGATIVE
Candida Vaginitis: NEGATIVE
Chlamydia: NEGATIVE
Comment: NEGATIVE
Comment: NEGATIVE
Comment: NEGATIVE
Comment: NEGATIVE
Comment: NEGATIVE
Comment: NORMAL
Neisseria Gonorrhea: NEGATIVE
Trichomonas: NEGATIVE

## 2021-06-23 LAB — CYTOLOGY - PAP
Comment: NEGATIVE
Diagnosis: NEGATIVE
Diagnosis: REACTIVE
High risk HPV: NEGATIVE

## 2021-06-26 ENCOUNTER — Ambulatory Visit: Payer: Medicaid Other | Admitting: Obstetrics

## 2021-07-02 LAB — ANAEROBIC AND AEROBIC CULTURE

## 2021-07-24 ENCOUNTER — Other Ambulatory Visit: Payer: Self-pay | Admitting: Obstetrics

## 2021-07-24 DIAGNOSIS — N764 Abscess of vulva: Secondary | ICD-10-CM

## 2021-08-06 IMAGING — US US MFM OB FOLLOW-UP
1 series · 14 of 28 positions shown · non-contrast
Comparison: none

[Series 1: us mfm ob follow-up · 38 acquisitions, 14 frames shown]
[im 2/38]
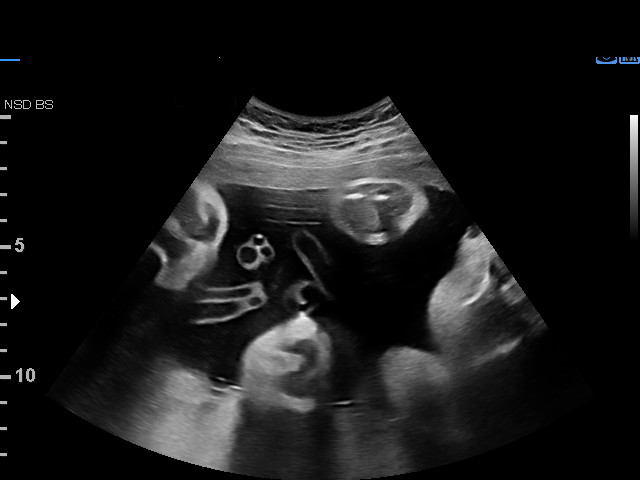
[im 5/38]
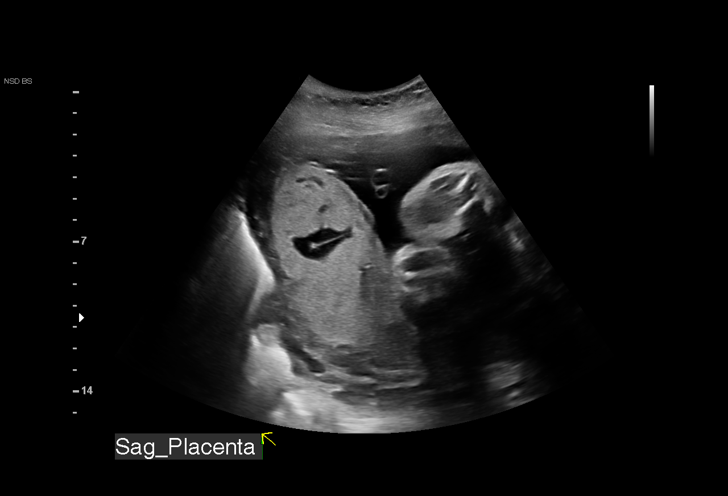
[im 7/38]
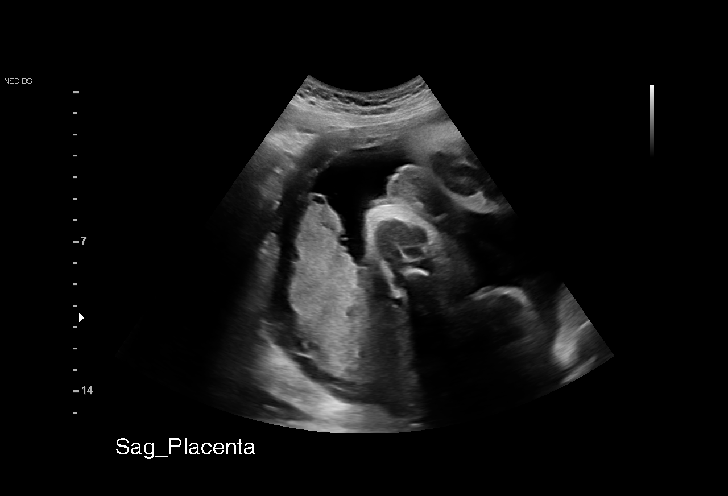
[im 10/38]
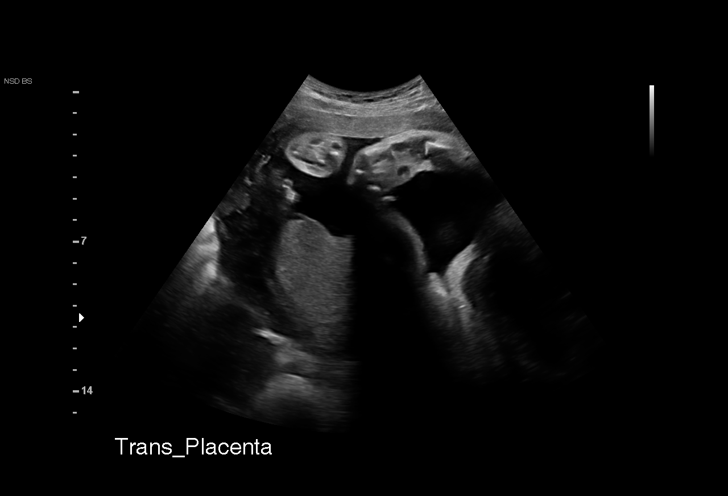
[im 13/38]
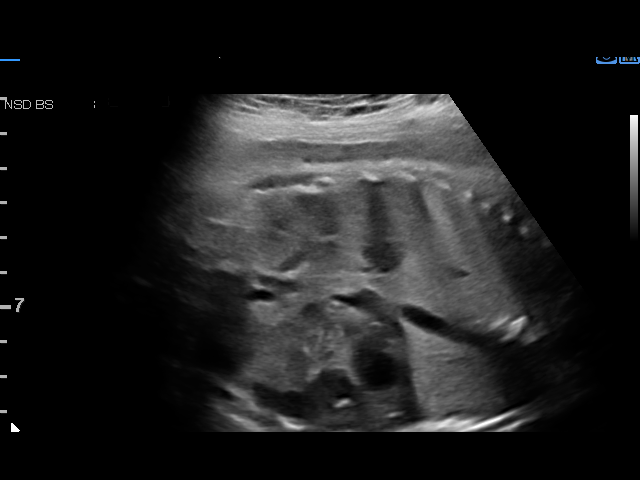
[im 16/38]
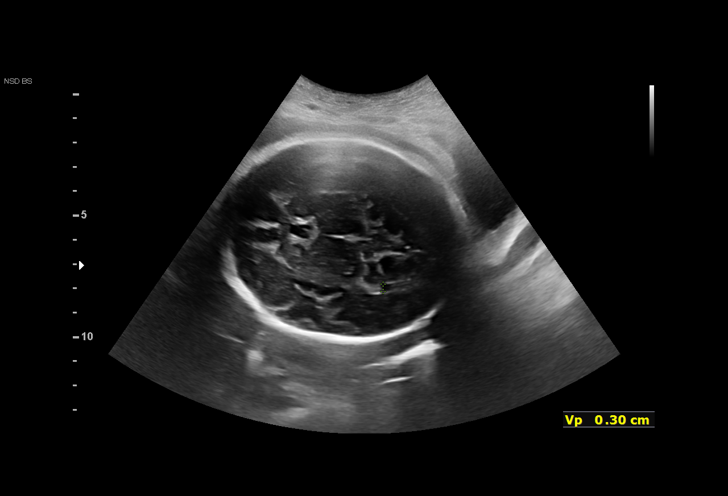
[im 18/38]
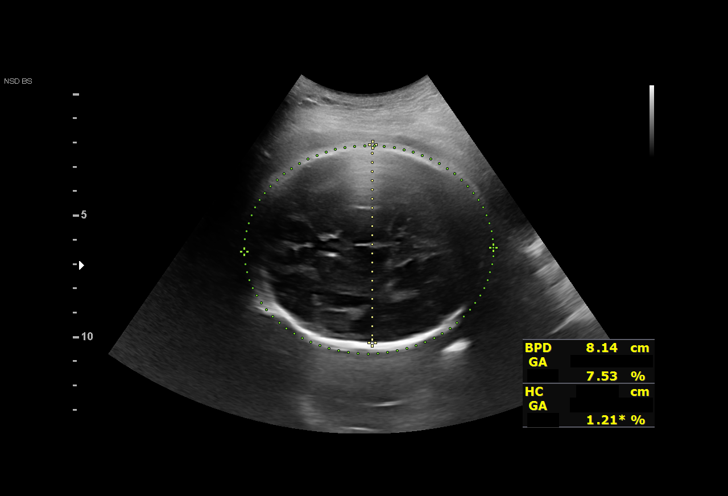
[im 21/38]
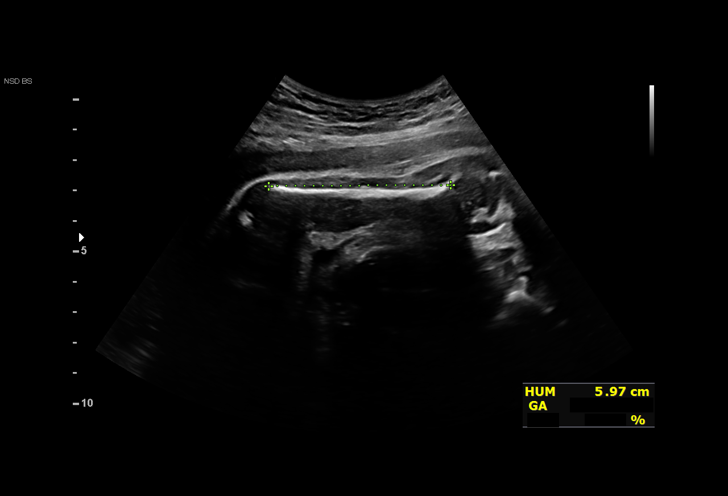
[im 24/38]
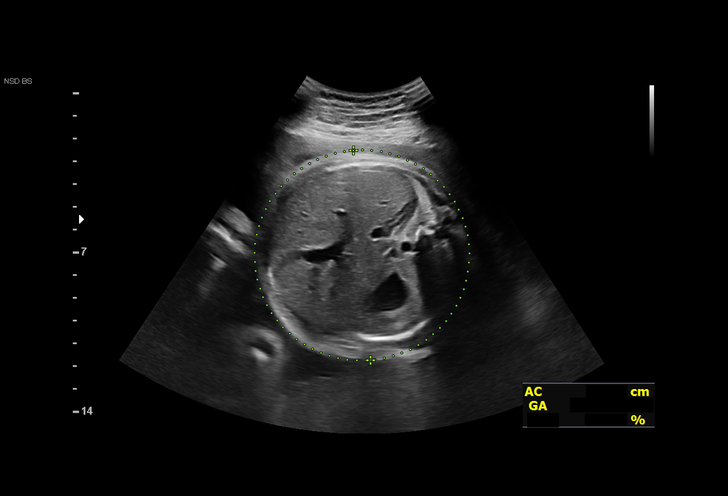
[im 27/38]
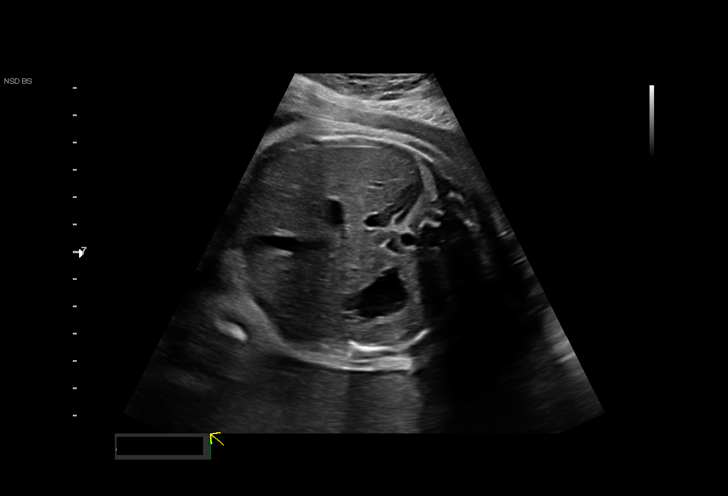
[im 29/38]
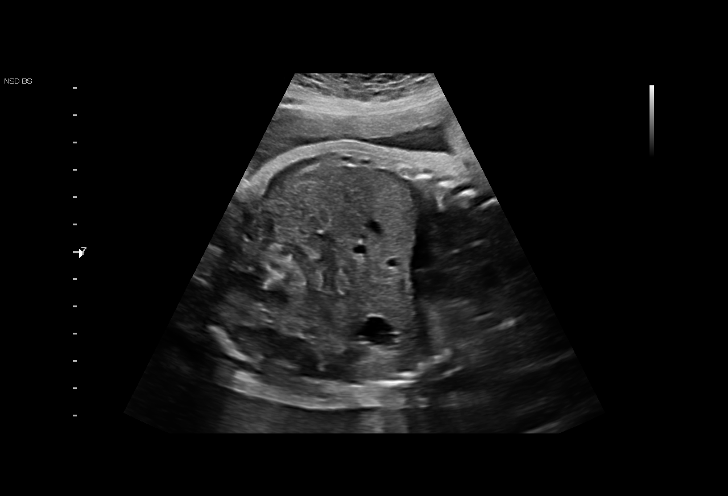
[im 32/38]
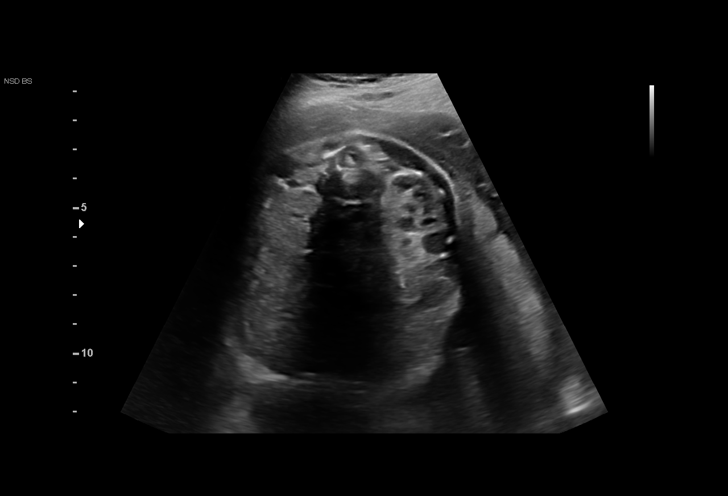
[im 35/38]
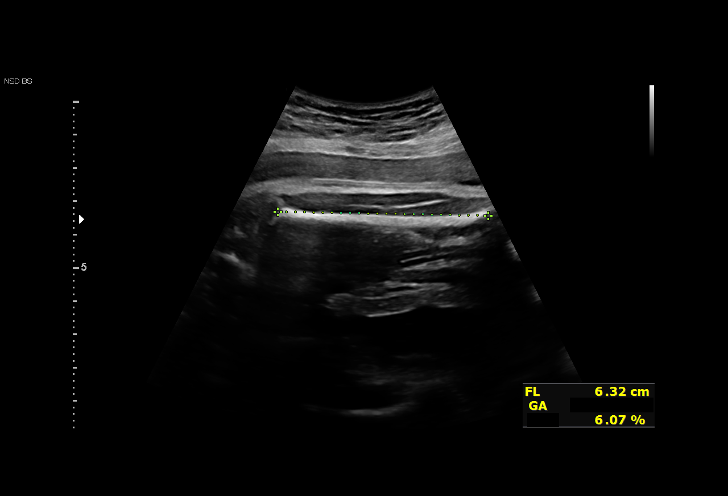
[im 38/38]
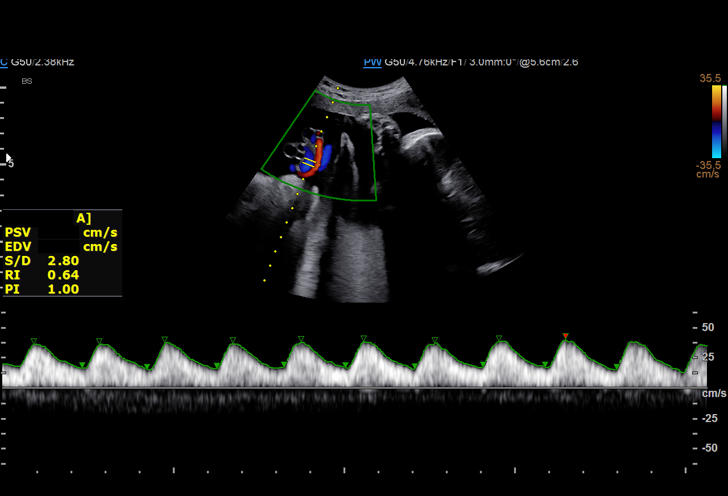

[14 of 28 positions shown; findings below may reference images not displayed]

Indications

 High risk pregnancy due to maternal drug
 abuse (Currently on Methadone)
 34 weeks gestation of pregnancy
 Advanced maternal age multigravida 35+,
 second trimester
 LR NIPS, neg Horizon
 Encounter for other antenatal screening
 follow-up
Fetal Evaluation

 Num Of Fetuses:         1
 Fetal Heart Rate(bpm):  136
 Cardiac Activity:       Observed
 Presentation:           Cephalic
 Placenta:               Posterior
 P. Cord Insertion:      Previously Visualized

 Amniotic Fluid
 AFI FV:      Within normal limits

 AFI Sum(cm)     %Tile       Largest Pocket(cm)
 15.48           56

 RUQ(cm)       RLQ(cm)       LUQ(cm)        LLQ(cm)

Biometry

 BPD:      81.1  mm     G. Age:  32w 4d          6  %    CI:        74.04   %    70 - 86
                                                         FL/HC:      21.2   %    20.1 -
 HC:      299.3  mm     G. Age:  33w 1d        2.3  %    HC/AC:      1.02        0.93 -
 AC:      294.8  mm     G. Age:  33w 3d         24  %    FL/BPD:     78.3   %    71 - 87
 FL:       63.5  mm     G. Age:  32w 6d          7  %    FL/AC:      21.5   %    20 - 24
 HUM:      58.3  mm     G. Age:  33w 5d         47  %

 LV:          3  mm

 Est. FW:    0524  gm    4 lb 11 oz      12  %
OB History

 Gravidity:    5         Term:   2        Prem:   0        SAB:   1
 TOP:          1       Ectopic:  0        Living: 2
Gestational Age

 LMP:           34w 4d        Date:  02/13/20                 EDD:   11/19/20
 U/S Today:     33w 0d                                        EDD:   11/30/20
 Best:          34w 4d     Det. By:  LMP  (02/13/20)          EDD:   11/19/20
Anatomy



 Other:  Male gender previously seen. Heels visualized previously.
Cervix Uterus Adnexa

 Cervix
 Not visualized (advanced GA >37wks)
Comments

 This patient was seen for a follow up growth scan due to
 maternal treatment with methadone.  She denies any
 problems since her last exam.
 She was informed that the fetal growth and amniotic fluid
 level appears appropriate for her gestational age.
 A follow up exam was scheduled in 4 weeks.

## 2022-02-12 ENCOUNTER — Emergency Department (HOSPITAL_BASED_OUTPATIENT_CLINIC_OR_DEPARTMENT_OTHER): Payer: Medicaid Other

## 2022-02-12 ENCOUNTER — Other Ambulatory Visit: Payer: Self-pay

## 2022-02-12 ENCOUNTER — Emergency Department (HOSPITAL_BASED_OUTPATIENT_CLINIC_OR_DEPARTMENT_OTHER)
Admission: EM | Admit: 2022-02-12 | Discharge: 2022-02-13 | Disposition: A | Discharge status: Home / Self Care | Payer: Medicaid Other | Attending: Emergency Medicine | Admitting: Emergency Medicine

## 2022-02-12 ENCOUNTER — Encounter (HOSPITAL_BASED_OUTPATIENT_CLINIC_OR_DEPARTMENT_OTHER): Payer: Self-pay | Admitting: Emergency Medicine

## 2022-02-12 DIAGNOSIS — I10 Essential (primary) hypertension: Secondary | ICD-10-CM | POA: Diagnosis not present

## 2022-02-12 DIAGNOSIS — R410 Disorientation, unspecified: Secondary | ICD-10-CM | POA: Insufficient documentation

## 2022-02-12 DIAGNOSIS — Z79899 Other long term (current) drug therapy: Secondary | ICD-10-CM | POA: Diagnosis not present

## 2022-02-12 DIAGNOSIS — H538 Other visual disturbances: Secondary | ICD-10-CM | POA: Insufficient documentation

## 2022-02-12 DIAGNOSIS — R519 Headache, unspecified: Secondary | ICD-10-CM | POA: Insufficient documentation

## 2022-02-12 DIAGNOSIS — R200 Anesthesia of skin: Secondary | ICD-10-CM | POA: Diagnosis not present

## 2022-02-12 DIAGNOSIS — Z5321 Procedure and treatment not carried out due to patient leaving prior to being seen by health care provider: Secondary | ICD-10-CM | POA: Insufficient documentation

## 2022-02-12 LAB — DIFFERENTIAL
Abs Immature Granulocytes: 0.03 10*3/uL (ref 0.00–0.07)
Basophils Absolute: 0.1 10*3/uL (ref 0.0–0.1)
Basophils Relative: 1 %
Eosinophils Absolute: 0.1 10*3/uL (ref 0.0–0.5)
Eosinophils Relative: 1 %
Immature Granulocytes: 0 %
Lymphocytes Relative: 33 %
Lymphs Abs: 2.9 10*3/uL (ref 0.7–4.0)
Monocytes Absolute: 0.7 10*3/uL (ref 0.1–1.0)
Monocytes Relative: 8 %
Neutro Abs: 5 10*3/uL (ref 1.7–7.7)
Neutrophils Relative %: 57 %

## 2022-02-12 LAB — CBC
HCT: 36.1 % (ref 36.0–46.0)
Hemoglobin: 12.8 g/dL (ref 12.0–15.0)
MCH: 30 pg (ref 26.0–34.0)
MCHC: 35.5 g/dL (ref 30.0–36.0)
MCV: 84.5 fL (ref 80.0–100.0)
Platelets: 271 10*3/uL (ref 150–400)
RBC: 4.27 MIL/uL (ref 3.87–5.11)
RDW: 14 % (ref 11.5–15.5)
WBC: 8.7 10*3/uL (ref 4.0–10.5)
nRBC: 0 % (ref 0.0–0.2)

## 2022-02-12 LAB — APTT: aPTT: 29 seconds (ref 24–36)

## 2022-02-12 LAB — URINALYSIS, ROUTINE W REFLEX MICROSCOPIC
Bilirubin Urine: NEGATIVE
Glucose, UA: NEGATIVE mg/dL
Ketones, ur: NEGATIVE mg/dL
Leukocytes,Ua: NEGATIVE
Nitrite: NEGATIVE
Protein, ur: NEGATIVE mg/dL
Specific Gravity, Urine: >=1.03 (ref 1.005–1.030)
pH: 5.5 (ref 5.0–8.0)

## 2022-02-12 LAB — COMPREHENSIVE METABOLIC PANEL
ALT: 17 U/L (ref 0–44)
AST: 16 U/L (ref 15–41)
Albumin: 4.2 g/dL (ref 3.5–5.0)
Alkaline Phosphatase: 95 U/L (ref 38–126)
Anion gap: 4 — ABNORMAL LOW (ref 5–15)
BUN: 17 mg/dL (ref 6–20)
CO2: 25 mmol/L (ref 22–32)
Calcium: 9.2 mg/dL (ref 8.9–10.3)
Chloride: 107 mmol/L (ref 98–111)
Creatinine, Ser: 1.13 mg/dL — ABNORMAL HIGH (ref 0.44–1.00)
GFR, Estimated: >60 mL/min (ref >60)
Glucose, Bld: 91 mg/dL (ref 70–99)
Potassium: 3.6 mmol/L (ref 3.5–5.1)
Sodium: 136 mmol/L (ref 135–145)
Total Bilirubin: 0.5 mg/dL (ref 0.3–1.2)
Total Protein: 7.7 g/dL (ref 6.5–8.1)

## 2022-02-12 LAB — RAPID URINE DRUG SCREEN, HOSP PERFORMED
Amphetamines: NOT DETECTED
Barbiturates: NOT DETECTED
Benzodiazepines: NOT DETECTED
Cocaine: NOT DETECTED
Opiates: NOT DETECTED
Tetrahydrocannabinol: NOT DETECTED

## 2022-02-12 LAB — URINALYSIS, MICROSCOPIC (REFLEX)

## 2022-02-12 LAB — PREGNANCY, URINE: Preg Test, Ur: NEGATIVE

## 2022-02-12 LAB — ETHANOL: Alcohol, Ethyl (B): <10 mg/dL (ref <10)

## 2022-02-12 LAB — PROTIME-INR
INR: 1 (ref 0.8–1.2)
Prothrombin Time: 13.6 seconds (ref 11.4–15.2)

## 2022-02-12 NOTE — ED Notes (Addendum)
Report to charge nurse at [facility], pt staes she is driving her self , pt has no  IV , states she has to go and  take care of her child before she goes to [facility]

## 2022-02-12 NOTE — ED Provider Notes (Signed)
[facility] EMERGENCY DEPARTMENT Provider Note   CSN: [id number] Arrival date & time: 02/12/22  1656     History  Chief Complaint  Patient presents with   Headache    Molly Thornton is a 36 y.o. female presenting from PCP for TIA rule out.  Patient began to have headaches at the end of last week and went to [facility] on Saturday.  She had negative lab work and CT at that time.  She reports later that night after she left she started to have a numb like sensation to the left posterior calf and left upper arm.  Simultaneously she felt as though she "lost control of her mouth" and her husband reported that she seemed to be having difficulty getting her words out and that her voice sounded different.  She denied any weakness but stated that she also had some black spots in her vision.  She localizes her headaches to the left posterior eye and says they come and go.   Denies drug or alcohol use.  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  Vapes daily, has done so for a year.   Headache Associated symptoms: numbness   Associated symptoms: no dizziness and no weakness       Home Medications Prior to Admission medications   Medication Sig Start Date End Date Taking? Authorizing Provider  acetaminophen (TYLENOL) 500 MG tablet Take 1,000 mg by mouth every 6 (six) hours as needed for mild pain or headache. Patient not taking: Reported on 06/20/2021    [provider]  albuterol (VENTOLIN HFA) 108 (90 Base) MCG/ACT inhaler Inhale 2 puffs into the lungs every 6 (six) hours as needed for wheezing or shortness of breath. Patient not taking: Reported on 06/20/2021    [provider]  Blood Pressure Monitoring (BLOOD PRESSURE KIT) DEVI 1 kit by Does not apply route once a week. Patient not taking: Reported on 06/20/2021 05/10/20   Constant, Peggy, MD  doxycycline (VIBRAMYCIN) 100 MG capsule Take 1 capsule (100 mg total) by mouth 2  (two) times daily. 06/20/21   Shelly Bombard, MD  furosemide (LASIX) 20 MG tablet Take 20 mg by mouth daily. 12/30/20   [provider]  ibuprofen (ADVIL) 600 MG tablet Take 1 tablet (600 mg total) by mouth every 6 (six) hours. Patient not taking: No sig reported 11/06/20   Griffin Basil, MD  ibuprofen (ADVIL) 800 MG tablet Take 1 tablet (800 mg total) by mouth every 8 (eight) hours as needed. 06/20/21   Shelly Bombard, MD  losartan (COZAAR) 25 MG tablet Take 25 mg by mouth daily. 11/09/20   [provider]  methadone (DOLOPHINE) 10 MG/ML solution Take 125 mg by mouth daily.    [provider]  metroNIDAZOLE (FLAGYL) 500 MG tablet Take 1 tablet (500 mg total) by mouth 2 (two) times daily. Patient not taking: Reported on 06/20/2021 01/09/21   Nugent, Gerrie Nordmann, NP  metroNIDAZOLE (FLAGYL) 500 MG tablet Take 1 tablet (500 mg total) by mouth 2 (two) times daily. 06/20/21   Shelly Bombard, MD  Prenat-Fe Poly-Methfol-FA-DHA (VITAFOL ULTRA) 29-0.6-0.4-200 MG CAPS Take 1 capsule by mouth daily before breakfast. Patient not taking: Reported on 06/20/2021 05/17/20   Shelly Bombard, MD      Allergies    Hydrocodone and Ivp dye [iodinated contrast media]    Review of Systems   Review of Systems  Eyes:  Positive for visual disturbance.  Respiratory:  Negative for shortness of breath.   Cardiovascular:  Negative for chest pain.  Neurological:  Positive for numbness and headaches. Negative for dizziness and weakness.  Psychiatric/Behavioral:  Negative for confusion.    Physical Exam Updated Vital Signs BP (!) 143/87 (BP Location: Left Arm)   Pulse 84   Temp 98.7 F (37.1 C) (Oral)   Resp 16   Ht '5\' 7"'  (1.702 m)   Wt 85.7 kg   SpO2 98%   BMI 29.60 kg/m  Physical Exam Vitals and nursing note reviewed.  Constitutional:      General: She is not in acute distress.    Appearance: Normal appearance. She is not ill-appearing.  HENT:     Head: Normocephalic and  atraumatic.  Eyes:     General: No visual field deficit or scleral icterus.    Conjunctiva/sclera: Conjunctivae normal.  Cardiovascular:     Rate and Rhythm: Normal rate and regular rhythm.  Pulmonary:     Effort: Pulmonary effort is normal. No respiratory distress.  Skin:    Findings: No rash.  Neurological:     Mental Status: She is alert.     GCS: GCS eye subscore is 4. GCS verbal subscore is 5. GCS motor subscore is 6.     Cranial Nerves: No cranial nerve deficit or dysarthria.     Sensory: Sensory deficit (Reports a "lighter" sensation on the left posterior calf when compared to the right) present.  Psychiatric:        Mood and Affect: Mood normal.    ED Results / Procedures / Treatments   Labs (all labs ordered are listed, but only abnormal results are displayed) Labs Reviewed - No data to display  EKG None  Radiology CT Head Wo Contrast  Result Date: 02/12/2022 CLINICAL DATA:  Moderate to severe head trauma with neurological deficit. Stroke suspected. EXAM: CT HEAD WITHOUT CONTRAST TECHNIQUE: Contiguous axial images were obtained from the base of the skull through the vertex without intravenous contrast. RADIATION DOSE REDUCTION: This exam was performed according to the departmental dose-optimization program which includes automated exposure control, adjustment of the mA and/or kV according to patient size and/or use of iterative reconstruction technique. COMPARISON:  06/19/2018 FINDINGS: Brain: No evidence of acute infarction, hemorrhage, hydrocephalus, extra-axial collection or mass lesion/mass effect. Vascular: No hyperdense vessel or unexpected calcification. Skull: Normal. Negative for fracture or focal lesion. Sinuses/Orbits: No acute finding. Other: None. IMPRESSION: No acute intracranial abnormalities. Electronically Signed   By: Lucienne Capers M.D.   On: 02/12/2022 17:53    Procedures Procedures   Medications Ordered in ED Medications - No data to display  ED  Course/ Medical Decision Making/ A&P                           Medical Decision Making Amount and/or Complexity of Data Reviewed Labs: ordered. Radiology: ordered.   This patient presents to the ED for concern of headache, dizziness, decreased sensation in left extremity.  Differential includes but is not limited to ischemia, hemorrhage, dissection, migraine/ocular migraine, aura, brain mass and hydrocephalus.  this is not an exhaustive differential.    Past Medical History / Co-morbidities / Social History: Drug and EtOH use, no longer uses any substances.  Vapes daily   Additional history: Additional history obtained from patient's primary care.  He called the department and I spoke with him and he is very concerned for potential TIA.  He reported that she was  symptomatic today however patient is denying that to me in the department.   Physical Exam: Physical exam performed. The pertinent findings include: Decreased sensation in posterior left lower extremity. No other findings.  Lab Tests: Pending.  CBC normal  Imaging Studies: I ordered imaging studies including CT head. I independently visualized and interpreted imaging which showed negative. I agree with the radiologist interpretation.   Cardiac Monitoring:  The patient was maintained on a cardiac monitor.  My attending physician Haviland viewed and interpreted the cardiac monitored which showed an underlying rhythm of: NSR   Disposition: After consideration of the diagnostic results and the patients response to treatment, I feel that patient to go to [facility] for MRI and continued TIA work-up. If MRI is negative, she should follow-up with pcp and neurology. If it shows any abnormalities, neurology likely needs to be consulted.   Final Clinical Impression(s) / ED Diagnoses Final diagnoses:  None    Rx / DC Orders Patient to report to [facility] emergency department for MRI.  Dr. Reather Converse has been contacted via secure  chat and will keep an eye out for the patient.   Darliss Ridgel 02/12/22 1917    Isla Pence, MD 02/12/22 [phone]

## 2022-02-12 NOTE — ED Notes (Signed)
Pt ambulatory to restroom

## 2022-02-12 NOTE — Discharge Instructions (Addendum)
Please go straight to the [facility] emergency department.  Show them your discharge paperwork and let them know you are there for an MRI.  If you have any recurrent symptoms please pull over your car and call 911.  Your work note is attached.

## 2022-02-12 NOTE — ED Triage Notes (Signed)
Headache x 3 days , left arm and left leg tingling and weak . PCP send her her to rule out TIA.  Alert and oriented x 4 . Ambulatory to triage .  emesis 2 days ago .

## 2022-02-13 ENCOUNTER — Emergency Department (HOSPITAL_COMMUNITY): Payer: Medicaid Other

## 2022-02-13 ENCOUNTER — Other Ambulatory Visit: Payer: Self-pay

## 2022-02-13 ENCOUNTER — Emergency Department (HOSPITAL_COMMUNITY)
Admission: EM | Admit: 2022-02-13 | Discharge: 2022-02-13 | Discharge status: Against Medical Advice | Payer: Medicaid Other | Source: Home / Self Care | Attending: Student | Admitting: Student

## 2022-02-13 DIAGNOSIS — I10 Essential (primary) hypertension: Secondary | ICD-10-CM | POA: Insufficient documentation

## 2022-02-13 DIAGNOSIS — Z5321 Procedure and treatment not carried out due to patient leaving prior to being seen by health care provider: Secondary | ICD-10-CM | POA: Insufficient documentation

## 2022-02-13 DIAGNOSIS — R519 Headache, unspecified: Secondary | ICD-10-CM | POA: Insufficient documentation

## 2022-02-13 DIAGNOSIS — R200 Anesthesia of skin: Secondary | ICD-10-CM | POA: Insufficient documentation

## 2022-02-13 DIAGNOSIS — R410 Disorientation, unspecified: Secondary | ICD-10-CM | POA: Insufficient documentation

## 2022-02-13 DIAGNOSIS — H538 Other visual disturbances: Secondary | ICD-10-CM | POA: Insufficient documentation

## 2022-02-13 NOTE — ED Notes (Signed)
PT stated that she is leaving, her ride is already here. LWBS

## 2022-02-13 NOTE — ED Provider Triage Note (Signed)
Emergency Medicine Provider Triage Evaluation Note  Molly Thornton , a 36 y.o. female  was evaluated in triage.  Pt complains of neurologic symptoms such as headache, left-sided numbness, blurred vision and confusion.  I saw this patient at [facility] yesterday and sent her here for MRI however she had childcare problems and was unable to come until today.  MRI has been ordered, lab work done yesterday.  PCP requested TIA work-up due to intermittent symptoms.  Does not meet criteria for code stroke  Reports that her symptoms are the same as previous days but this morning she again woke up with numbness on her left side and pulsatile headache.  Review of Systems  Positive: Left-sided numbness, headache, occasional blurred vision Negative: Syncope, weakness or slurred speech  Physical Exam  BP 100/63 (BP Location: Right Arm)   Pulse 87   Temp 98.7 F (37.1 C) (Oral)   Resp 17   SpO2 97%  Gen:   Awake, no distress   Resp:  Normal effort  MSK:   Moves extremities without difficulty  Other:    Medical Decision Making  Medically screening exam initiated at 11:53 AM.  Appropriate orders placed.  Molly Thornton was informed that the remainder of the evaluation will be completed by another provider, this initial triage assessment does not replace that evaluation, and the importance of remaining in the ED until their evaluation is complete.     Rhae Hammock, PA-C 02/13/22 1155

## 2022-02-13 NOTE — ED Notes (Addendum)
Pt was sent to [facility] ED for MRI, pt stated needed to go home and settle her children. Pt was made aware if she did not go on 5/22 she would have to start process over. Pt has not arrived as of midnight. Will be discharged.

## 2022-02-13 NOTE — ED Provider Notes (Signed)
Patient eloped prior to my evaluation   Teressa Lower, MD 02/13/22 [phone]

## 2022-02-13 NOTE — ED Triage Notes (Signed)
Pt c/o ongoing headache, blurry vision,  and left sided numbness since 5/20.  HX: HTN

## 2022-05-03 ENCOUNTER — Encounter: Payer: Self-pay | Admitting: Obstetrics

## 2022-05-04 ENCOUNTER — Other Ambulatory Visit: Payer: Self-pay | Admitting: *Deleted

## 2022-05-04 MED ORDER — METRONIDAZOLE 500 MG PO TABS: 500.0000 mg | ORAL_TABLET | Freq: Two times a day (BID) | ORAL | 0 refills | Status: DC

## 2022-05-04 NOTE — Progress Notes (Signed)
Flagyl sent for BV symptoms

## 2022-11-13 ENCOUNTER — Telehealth: Payer: Medicaid Other | Admitting: Family Medicine

## 2022-11-13 DIAGNOSIS — B9689 Other specified bacterial agents as the cause of diseases classified elsewhere: Secondary | ICD-10-CM

## 2022-11-13 DIAGNOSIS — N760 Acute vaginitis: Secondary | ICD-10-CM | POA: Diagnosis not present

## 2022-11-13 MED ORDER — METRONIDAZOLE 500 MG PO TABS: 500.0000 mg | ORAL_TABLET | Freq: Two times a day (BID) | ORAL | 0 refills | Status: AC

## 2022-11-13 NOTE — Progress Notes (Signed)
Virtual Visit Consent   Molly Thornton, you are scheduled for a virtual visit with a [facility] provider today. Just as with appointments in the office, your consent must be obtained to participate. Your consent will be active for this visit and any virtual visit you may have with one of our providers in the next 365 days. If you have a MyChart account, a copy of this consent can be sent to you electronically.  As this is a virtual visit, video technology does not allow for your provider to perform a traditional examination. This may limit your provider's ability to fully assess your condition. If your provider identifies any concerns that need to be evaluated in person or the need to arrange testing (such as labs, EKG, etc.), we will make arrangements to do so. Although advances in technology are sophisticated, we cannot ensure that it will always work on either your end or our end. If the connection with a video visit is poor, the visit may have to be switched to a telephone visit. With either a video or telephone visit, we are not always able to ensure that we have a secure connection.  By engaging in this virtual visit, you consent to the provision of healthcare and authorize for your insurance to be billed (if applicable) for the services provided during this visit. Depending on your insurance coverage, you may receive a charge related to this service.  I need to obtain your verbal consent now. Are you willing to proceed with your visit today? Molly Thornton has provided verbal consent on 11/13/2022 for a virtual visit (video or telephone). Molly Mayo, NP  Date: 11/13/2022 3:13 PM  Virtual Visit via Video Note   I, Molly Thornton, connected with  Molly Thornton  ([id number], 37) on 11/13/22 at  3:30 PM EST by a video-enabled telemedicine application and verified that I am speaking with the correct person using two identifiers.  Location: Patient: Virtual Visit Location  Patient: Home Provider: Virtual Visit Location Provider: Home Office   I discussed the limitations of evaluation and management by telemedicine and the availability of in person appointments. The patient expressed understanding and agreed to proceed.    History of Present Illness: Molly Thornton is a 37 y.o. who identifies as a female who was assigned female at birth, and is being seen today for BV Reports this is frequent- since having IUD placed- reports last treatment was in the Fall of 2023.   HPI: Vaginal Discharge The patient's primary symptoms include a genital odor and vaginal discharge. The patient's pertinent negatives include no genital itching, genital lesions, genital rash, missed menses, pelvic pain or vaginal bleeding. This is a new problem. The current episode started 1 to 4 weeks ago. The problem occurs constantly. The problem has been gradually worsening. The patient is experiencing no pain. She is not pregnant. Pertinent negatives include no abdominal pain, anorexia, back pain, chills, constipation, diarrhea, discolored urine, dysuria, fever, flank pain, frequency, headaches, hematuria, joint pain, joint swelling, nausea, painful intercourse, rash, sore throat, urgency or vomiting. The vaginal discharge was copious, malodorous, mucopurulent and white. The vaginal bleeding is spotting. The symptoms are aggravated by intercourse. The treatment provided mild relief. She is sexually active. It is unknown whether or not her partner has an STD. She uses an IUD for contraception.    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 (Patient not taking: Reported on 06/20/2021), Disp: , Rfl:    albuterol (VENTOLIN HFA) 108 (90 Base) MCG/ACT inhaler, Inhale 2 puffs into the lungs every 6 (six) hours as needed for wheezing or shortness of breath. (Patient not taking: Reported on 06/20/2021), Disp: , Rfl:    Blood Pressure Monitoring (BLOOD PRESSURE KIT) DEVI, 1 kit by Does not apply route once a week. (Patient not taking: Reported on 06/20/2021), Disp: 1 each, Rfl: 0   doxycycline (VIBRAMYCIN) 100 MG capsule, Take 1 capsule (100 mg total) by mouth 2 (two) times daily., Disp: 14 capsule, Rfl: 0   furosemide (LASIX) 20 MG tablet, Take 20 mg by mouth daily., Disp: , Rfl:    ibuprofen (ADVIL) 600 MG tablet, Take 1 tablet (600 mg total) by mouth every 6 (six) hours. (Patient not taking: No sig reported), Disp: 30 tablet, Rfl: 2   ibuprofen (ADVIL) 800 MG tablet, Take 1 tablet (800 mg total) by mouth every 8 (eight) hours as needed., Disp: 30 tablet, Rfl: 5   losartan (COZAAR) 25 MG tablet, Take 25 mg by mouth daily., Disp: , Rfl:    methadone (DOLOPHINE) 10 MG/ML solution, Take 125 mg by mouth daily., Disp: , Rfl:    metroNIDAZOLE (FLAGYL) 500 MG tablet, Take 1 tablet (500 mg total) by mouth 2 (two) times daily., Disp: 14 tablet, Rfl: 0   Prenat-Fe Poly-Methfol-FA-DHA (VITAFOL ULTRA) 29-0.6-0.4-200 MG CAPS, Take 1 capsule by mouth daily before breakfast. (Patient not taking: Reported on 06/20/2021), Disp: 90 capsule, Rfl: 4  Observations/Objective: Patient is well-developed, well-nourished in no acute distress.  Resting  comfortably  at home.  Head is normocephalic, atraumatic.  No labored breathing.  Speech is clear and coherent with logical content.  Patient is alert and oriented at baseline.    Assessment and Plan:  1. BV (bacterial vaginosis)  - metroNIDAZOLE (FLAGYL) 500 MG tablet; Take 1 tablet (500 mg total) by mouth 2 (two) times daily for 7 days.  Dispense: 14 tablet; Refill: 0  -IUD - she thinks is the caused- advised follow up with GYN about this -boric acid supp discussed  -strict in person precautions reviewed   Reviewed side effects, risks and benefits of medication.    Patient acknowledged agreement and understanding of the plan.   Past Medical, Surgical, Social History, Allergies, and Medications have been Reviewed.    Follow Up Instructions: I discussed the assessment and treatment plan with the patient. The patient was provided an opportunity to ask questions and all were answered. The patient agreed with the plan and demonstrated an understanding of the instructions.  A copy of instructions were sent to the patient via MyChart unless otherwise noted below.     The patient was advised to call back or seek an in-person evaluation if the symptoms worsen or if the condition fails to improve as  anticipated.  Time:  I spent 7 minutes with the patient via telehealth technology discussing the above problems/concerns.    Molly Mayo, NP

## 2022-11-13 NOTE — Patient Instructions (Signed)
Molly Thornton, thank you for joining [name], NP for today's virtual visit.  While this provider is not your primary care provider (PCP), if your PCP is located in our provider database this encounter information will be shared with them immediately following your visit.   [facility] account gives you access to today's visit and all your visits, tests, and labs performed at [facility] " click here if you don't have a [facility] account or go to [email or web address]  Consent: (Patient) Molly Thornton provided verbal consent for this virtual visit at the beginning of the encounter.  Current Medications:  Current Outpatient Medications:    metroNIDAZOLE (FLAGYL) 500 MG tablet, Take 1 tablet (500 mg total) by mouth 2 (two) times daily for 7 days., Disp: 14 tablet, Rfl: 0   acetaminophen (TYLENOL) 500 MG tablet, Take 1,000 mg by mouth every 6 (six) hours as needed for mild pain or headache. (Patient not taking: Reported on 06/20/2021), Disp: , Rfl:    albuterol (VENTOLIN HFA) 108 (90 Base) MCG/ACT inhaler, Inhale 2 puffs into the lungs every 6 (six) hours as needed for wheezing or shortness of breath. (Patient not taking: Reported on 06/20/2021), Disp: , Rfl:    Blood Pressure Monitoring (BLOOD PRESSURE KIT) DEVI, 1 kit by Does not apply route once a week. (Patient not taking: Reported on 06/20/2021), Disp: 1 each, Rfl: 0   furosemide (LASIX) 20 MG tablet, Take 20 mg by mouth daily., Disp: , Rfl:    ibuprofen (ADVIL) 600 MG tablet, Take 1 tablet (600 mg total) by mouth every 6 (six) hours. (Patient not taking: No sig reported), Disp: 30 tablet, Rfl: 2   ibuprofen (ADVIL) 800 MG tablet, Take 1 tablet (800 mg total) by mouth every 8 (eight) hours as needed., Disp: 30 tablet, Rfl: 5   losartan (COZAAR) 25 MG tablet, Take 25 mg by mouth daily., Disp: , Rfl:    methadone (DOLOPHINE) 10 MG/ML solution, Take 125 mg by mouth daily., Disp: , Rfl:    Prenat-Fe  Poly-Methfol-FA-DHA (VITAFOL ULTRA) 29-0.6-0.4-200 MG CAPS, Take 1 capsule by mouth daily before breakfast. (Patient not taking: Reported on 06/20/2021), Disp: 90 capsule, Rfl: 4   Medications ordered in this encounter:  Meds ordered this encounter  Medications   metroNIDAZOLE (FLAGYL) 500 MG tablet    Sig: Take 1 tablet (500 mg total) by mouth 2 (two) times daily for 7 days.    Dispense:  14 tablet    Refill:  0    Order Specific Question:   Supervising Provider    Answer:   Chase Picket [id number]     *If you need refills on other medications prior to your next appointment, please contact your pharmacy*  Follow-Up: Call back or seek an in-person evaluation if the symptoms worsen or if the condition fails to improve as anticipated.  [place] [phone]  Other Instructions Bacterial Vaginosis  Bacterial vaginosis is an infection of the vagina. It happens when too many normal germs (healthy bacteria) grow in the vagina. This infection can make it easier to get other infections from sex (STIs). It is very important for pregnant women to get treated. This infection can cause babies to be born early or at a low birth weight. What are the causes? This infection is caused by an increase in certain germs that grow in the vagina. You cannot get this infection from toilet seats, bedsheets, swimming pools, or things that touch your vagina. What  increases the risk? Having sex with a new person or more than one person. Having sex without protection. Douching. Having an intrauterine device (IUD). Smoking. Using drugs or drinking alcohol. These can lead you to do things that are risky. Taking certain antibiotic medicines. Being pregnant. What are the signs or symptoms? Some women have no symptoms. Symptoms may include: A discharge from your vagina. It may be gray or white. It can be watery or foamy. A fishy smell. This can happen after sex or during your menstrual  period. Itching in and around your vagina. A feeling of burning or pain when you pee (urinate). How is this treated? This infection is treated with antibiotic medicines. These may be given to you as: A pill. A cream for your vagina. A medicine that you put into your vagina (suppository). If the infection comes back after treatment, you may need more antibiotics. Follow these instructions at home: Medicines Take over-the-counter and prescription medicines as told by your doctor. Take or use your antibiotic medicine as told by your doctor. Do not stop taking or using it, even if you start to feel better. General instructions If the person you have sex with is a woman, tell her that you have this infection. She will need to follow up with her doctor. If you have a female partner, he does not need to be treated. Do not have sex until you finish treatment. Drink enough fluid to keep your pee pale yellow. Keep your vagina and butt clean. Wash the area with warm water each day. Wipe from front to back after you use the toilet. If you are breastfeeding a baby, ask your doctor if you should keep doing so during treatment. Keep all follow-up visits. How is this prevented? Self-care Do not douche. Use only warm water to wash around your vagina. Wear underwear that is cotton or lined with cotton. Do not wear tight pants and pantyhose, especially in the summer. Safe sex Use protection when you have sex. This includes: Use condoms. Use dental dams. This is a thin layer that protects the mouth during oral sex. Limit how many people you have sex with. To prevent this infection, it is best to have sex with just one person. Get tested for STIs. The person you have sex with should also get tested. Drugs and alcohol Do not smoke or use any products that contain nicotine or tobacco. If you need help quitting, ask your doctor. Do not use drugs. Do not drink alcohol if: Your doctor tells you not to  drink. You are pregnant, may be pregnant, or are planning to become pregnant. If you drink alcohol: Limit how much you have to 0-1 drink a day. Know how much alcohol is in your drink. In the [place] one drink equals one 12 oz bottle of beer (355 mL), one 5 oz glass of wine (148 mL), or one 1 oz glass of hard liquor (44 mL). Where to find more information Centers for Disease Control and Prevention: http://www.wolf.info/ American Sexual Health Association: www.ashastd.org Office on [employer] Health: [place] Contact a doctor if: Your symptoms do not get better, even after you are treated. You have more discharge or pain when you pee. You have a fever or chills. You have pain in your belly (abdomen) or in the area between your hips. You have pain with sex. You bleed from your vagina between menstrual periods. Summary This infection can happen when too many germs (bacteria) grow in the vagina. This infection can make  it easier to get infections from sex (STIs). Treating this can lower that chance. Get treated if you are pregnant. This infection can cause babies to be born early. Do not stop taking or using your antibiotic medicine, even if you start to feel better. This information is not intended to replace advice given to you by your health care provider. Make sure you discuss any questions you have with your health care provider. Document Revised: 03/10/2020 Document Reviewed: 03/10/2020 Elsevier Patient Education  [place].    If you have been instructed to have an in-person evaluation today at a local Urgent Care facility, please use the link below. It will take you to a list of all of our available [facility], including address, phone number and hours of operation. Please do not delay care.  [facility]  If you or a family member do not have a primary care provider, use the link below to schedule a visit and establish care. When you choose a [facility] primary care physician or advanced practice provider, you gain a long-term partner in health. Find a Primary Care Provider  Learn more about [facility] in-office and virtual care options: [facility]
